# Patient Record
Sex: Male | Born: 1960 | Hispanic: No | Marital: Married
Health system: Midwestern US, Academic
[De-identification: ages and names within clinical notes are randomized; demographics above are authoritative.]

## PROBLEM LIST (undated history)

## (undated) DIAGNOSIS — E785 Hyperlipidemia, unspecified: Secondary | ICD-10-CM

## (undated) DIAGNOSIS — E1165 Type 2 diabetes mellitus with hyperglycemia: Secondary | ICD-10-CM

## (undated) HISTORY — PX: APPENDECTOMY: SHX54

## (undated) HISTORY — DX: Type 2 diabetes mellitus with hyperglycemia: E11.65

---

## 2012-10-21 ENCOUNTER — Encounter: Payer: Self-pay | Admitting: Family Medicine

## 2012-10-21 ENCOUNTER — Ambulatory Visit (INDEPENDENT_AMBULATORY_CARE_PROVIDER_SITE_OTHER): Payer: BC Managed Care – PPO | Admitting: Family Medicine

## 2012-10-21 VITALS — BP 129/77 | HR 92 | Temp 97.4°F | Ht 74.5 in | Wt 222.0 lb

## 2012-10-21 DIAGNOSIS — F17200 Nicotine dependence, unspecified, uncomplicated: Secondary | ICD-10-CM

## 2012-10-21 DIAGNOSIS — R739 Hyperglycemia, unspecified: Secondary | ICD-10-CM

## 2012-10-21 DIAGNOSIS — Z0000 Encounter for general adult medical examination without abnormal findings: Secondary | ICD-10-CM

## 2012-10-21 DIAGNOSIS — IMO0001 Reserved for inherently not codable concepts without codable children: Secondary | ICD-10-CM

## 2012-10-21 DIAGNOSIS — R7309 Other abnormal glucose: Secondary | ICD-10-CM

## 2012-10-21 DIAGNOSIS — Z720 Tobacco use: Secondary | ICD-10-CM

## 2012-10-21 MED ORDER — METFORMIN HCL 500 MG PO TABS: 500.0000 mg | ORAL_TABLET | Freq: Two times a day (BID) | ORAL | Status: DC

## 2012-10-21 NOTE — Patient Instructions (Addendum)
Smoking Cessation Quitting smoking is important to your health and has many advantages. However, it is not always easy to quit since nicotine is a very addictive drug. Often times, people try 3 times or more before being able to quit. This document explains the best ways for you to prepare to quit smoking. Quitting takes hard work and a lot of effort, but you can do it. ADVANTAGES OF QUITTING SMOKING  You will live longer, feel better, and live better.  Your body will feel the impact of quitting smoking almost immediately.  Within 20 minutes, blood pressure decreases. Your pulse returns to its normal level.  After 8 hours, carbon monoxide levels in the blood return to normal. Your oxygen level increases.  After 24 hours, the chance of having a heart attack starts to decrease. Your breath, hair, and body stop smelling like smoke.  After 48 hours, damaged nerve endings begin to recover. Your sense of taste and smell improve.  After 72 hours, the body is virtually free of nicotine. Your bronchial tubes relax and breathing becomes easier.  After 2 to 12 weeks, lungs can hold more air. Exercise becomes easier and circulation improves.  The risk of having a heart attack, stroke, cancer, or lung disease is greatly reduced.  After 1 year, the risk of coronary heart disease is cut in half.  After 5 years, the risk of stroke falls to the same as a nonsmoker.  After 10 years, the risk of lung cancer is cut in half and the risk of other cancers decreases significantly.  After 15 years, the risk of coronary heart disease drops, usually to the level of a nonsmoker.  If you are pregnant, quitting smoking will improve your chances of having a healthy baby.  The people you live with, especially any children, will be healthier.  You will have extra money to spend on things other than cigarettes. QUESTIONS TO THINK ABOUT BEFORE ATTEMPTING TO QUIT You may want to talk about your answers with your  caregiver.  Why do you want to quit?  If you tried to quit in the past, what helped and what did not?  What will be the most difficult situations for you after you quit? How will you plan to handle them?  Who can help you through the tough times? Your family? Friends? A caregiver?  What pleasures do you get from smoking? What ways can you still get pleasure if you quit? Here are some questions to ask your caregiver:  How can you help me to be successful at quitting?  What medicine do you think would be best for me and how should I take it?  What should I do if I need more help?  What is smoking withdrawal like? How can I get information on withdrawal? GET READY  Set a quit date.  Change your environment by getting rid of all cigarettes, ashtrays, matches, and lighters in your home, car, or work. Do not let people smoke in your home.  Review your past attempts to quit. Think about what worked and what did not. GET SUPPORT AND ENCOURAGEMENT You have a better chance of being successful if you have help. You can get support in many ways.  Tell your family, friends, and co-workers that you are going to quit and need their support. Ask them not to smoke around you.  Get individual, group, or telephone counseling and support. Programs are available at local hospitals and health centers. Call your local health department for   information about programs in your area.  Spiritual beliefs and practices may help some smokers quit.  Download a "quit meter" on your computer to keep track of quit statistics, such as how long you have gone without smoking, cigarettes not smoked, and money saved.  Get a self-help book about quitting smoking and staying off of tobacco. LEARN NEW SKILLS AND BEHAVIORS  Distract yourself from urges to smoke. Talk to someone, go for a walk, or occupy your time with a task.  Change your normal routine. Take a different route to work. Drink tea instead of coffee.  Eat breakfast in a different place.  Reduce your stress. Take a hot bath, exercise, or read a book.  Plan something enjoyable to do every day. Reward yourself for not smoking.  Explore interactive web-based programs that specialize in helping you quit. GET MEDICINE AND USE IT CORRECTLY Medicines can help you stop smoking and decrease the urge to smoke. Combining medicine with the above behavioral methods and support can greatly increase your chances of successfully quitting smoking.  Nicotine replacement therapy helps deliver nicotine to your body without the negative effects and risks of smoking. Nicotine replacement therapy includes nicotine gum, lozenges, inhalers, nasal sprays, and skin patches. Some may be available over-the-counter and others require a prescription.  Antidepressant medicine helps people abstain from smoking, but how this works is unknown. This medicine is available by prescription.  Nicotinic receptor partial agonist medicine simulates the effect of nicotine in your brain. This medicine is available by prescription. Ask your caregiver for advice about which medicines to use and how to use them based on your health history. Your caregiver will tell you what side effects to look out for if you choose to be on a medicine or therapy. Carefully read the information on the package. Do not use any other product containing nicotine while using a nicotine replacement product.  RELAPSE OR DIFFICULT SITUATIONS Most relapses occur within the first 3 months after quitting. Do not be discouraged if you start smoking again. Remember, most people try several times before finally quitting. You may have symptoms of withdrawal because your body is used to nicotine. You may crave cigarettes, be irritable, feel very hungry, cough often, get headaches, or have difficulty concentrating. The withdrawal symptoms are only temporary. They are strongest when you first quit, but they will go away within  10 14 days. To reduce the chances of relapse, try to:  Avoid drinking alcohol. Drinking lowers your chances of successfully quitting.  Reduce the amount of caffeine you consume. Once you quit smoking, the amount of caffeine in your body increases and can give you symptoms, such as a rapid heartbeat, sweating, and anxiety.  Avoid smokers because they can make you want to smoke.  Do not let weight gain distract you. Many smokers will gain weight when they quit, usually less than 10 pounds. Eat a healthy diet and stay active. You can always lose the weight gained after you quit.  Find ways to improve your mood other than smoking. FOR MORE INFORMATION  www.smokefree.gov  Document Released: 04/29/2001 Document Revised: 11/04/2011 Document Reviewed: 08/14/2011 [facility] Patient Information 2014 [place], [place]. Diabetes and Exercise Regular exercise is important and can help:   Control blood glucose (sugar).  Decrease blood pressure.    Control blood lipids (cholesterol, triglycerides).  Improve overall health. BENEFITS FROM EXERCISE  Improved fitness.  Improved flexibility.  Improved endurance.  Increased bone density.  Weight control.  Increased muscle strength.  Decreased body  fat.  Improvement of the body's use of insulin, a hormone.  Increased insulin sensitivity.  Reduction of insulin needs.  Reduced stress and tension.  Helps you feel better. People with diabetes who add exercise to their lifestyle gain additional benefits, including:  Weight loss.  Reduced appetite.  Improvement of the body's use of blood glucose.  Decreased risk factors for heart disease:  Lowering of cholesterol and triglycerides.  Raising the level of good cholesterol (high-density lipoproteins, HDL).  Lowering blood sugar.  Decreased blood pressure. TYPE 1 DIABETES AND EXERCISE  Exercise will usually lower your blood glucose.  If blood glucose is greater than 240 mg/dl,  check urine ketones. If ketones are present, do not exercise.  Location of the insulin injection sites may need to be adjusted with exercise. Avoid injecting insulin into areas of the body that will be exercised. For example, avoid injecting insulin into:  The arms when playing tennis.  The legs when jogging. For more information, discuss this with your caregiver.  Keep a record of:  Food intake.  Type and amount of exercise.  Expected peak times of insulin action.  Blood glucose levels. Do this before, during, and after exercise. Review your records with your caregiver. This will help you to develop guidelines for adjusting food intake and insulin amounts.  TYPE 2 DIABETES AND EXERCISE  Regular physical activity can help control blood glucose.  Exercise is important because it may:  Increase the body's sensitivity to insulin.  Improve blood glucose control.  Exercise reduces the risk of heart disease. It decreases serum cholesterol and triglycerides. It also lowers blood pressure.  Those who take insulin or oral hypoglycemic agents should watch for signs of hypoglycemia. These signs include dizziness, shaking, sweating, chills, and confusion.  Body water is lost during exercise. It must be replaced. This will help to avoid loss of body fluids (dehydration) or heat stroke. Be sure to talk to your caregiver before starting an exercise program to make sure it is safe for you. Remember, any activity is better than none.  Document Released: 07/26/2003 Document Revised: 07/28/2011 Document Reviewed: 11/09/2008  Diets for Diabetes, Food Labeling Look at food labels to help you decide how much of a product you can eat. You will want to check the amount of total carbohydrate in a serving to see how the food fits into your meal plan. In the list of ingredients, the ingredient present in the largest amount by weight must be listed first, followed by the other ingredients in descending  order. STANDARD OF IDENTITY Most products have a list of ingredients. However, foods that the Food and Drug Administration (FDA) has given a standard of identity do not need a list of ingredients. A standard of identity means that a food must contain certain ingredients if it is called a particular name. Examples are mayonnaise, peanut butter, ketchup, jelly, and cheese. LABELING TERMS There are many terms found on food labels. Some of these terms have specific definitions. Some terms are regulated by the FDA, and the FDA has clearly specified how they can be used. Others are not regulated or well-defined and can be misleading and confusing. SPECIFICALLY DEFINED TERMS Nutritive Sweetener.  A sweetener that contains calories,such as table sugar or honey. Nonnutritive Sweetener.  A sweetener with few or no calories,such as saccharin, aspartame, sucralose, and cyclamate. LABELING TERMS REGULATED BY THE FDA Free.  The product contains only a tiny or small amount of fat, cholesterol, sodium, sugar, or calories. For example,  a "fat-free" product will contain less than 0.5 g of fat per serving. Low.  A food described as "low" in fat, saturated fat, cholesterol, sodium, or calories could be eaten fairly often without exceeding dietary guidelines. For example, "low in fat" means no more than 3 g of fat per serving. Lean.  "Lean" and "extra lean" are U.S. Department of Agriculture Architect) terms for use on meat and poultry products. "Lean" means the product contains less than 10 g of fat, 4 g of saturated fat, and 95 mg of cholesterol per serving. "Lean" is not as low in fat as a product labeled "low." Extra Lean.  "Extra lean" means the product contains less than 5 g of fat, 2 g of saturated fat, and 95 mg of cholesterol per serving. While "extra lean" has less fat than "lean," it is still higher in fat than a product labeled "low." Reduced, Less, Fewer.  A diet product that contains 25% less of a  nutrient or calories than the regular version. For example, hot dogs might be labeled "25% less fat than our regular hot dogs." Light/Lite.  A diet product that contains  fewer calories or  the fat of the original. For example, "light in sodium" means a product with  the usual sodium. More.  One serving contains at least 10% more of the daily value of a vitamin, mineral, or fiber than usual. Good Source Of.  One serving contains 10% to 19% of the daily value for a particular vitamin, mineral, or fiber. Excellent Source Of.  One serving contains 20% or more of the daily value for a particular nutrient. Other terms used might be "high in" or "rich in." Enriched or Fortified.  The product contains added vitamins, minerals, or protein. Nutrition labeling must be used on enriched or fortified foods. Imitation.  The product has been altered so that it is lower in protein, vitamins, or minerals than the usual food,such as imitation peanut butter. Total Fat.  The number listed is the total of all fat found in a serving of the product. Under total fat, food labels must list saturated fat and trans fat, which are associated with raising bad cholesterol and an increased risk of heart blood vessel disease. Saturated Fat.  Mainly fats from animal-based sources. Some examples are red meat, cheese, cream, whole milk, and coconut oil. Trans Fat.  Found in some fried snack foods, packaged foods, and fried restaurant foods. It is recommended you eat as close to 0 g of trans fat as possible, since it raises bad cholesterol and lowers good cholesterol. Polyunsaturated and Monounsaturated Fats.  More healthful fats. These fats are from plant sources. Total Carbohydrate.  The number of carbohydrate grams in a serving of the product. Under total carbohydrate are listed the other carbohydrate sources, such as dietary fiber and sugars. Dietary Fiber.  A carbohydrate from plant  sources. Sugars.  Sugars listed on the label contain all naturally occurring sugars as well as added sugars. LABELING TERMS NOT REGULATED BY THE FDA Sugarless.  Table sugar (sucrose) has not been added. However, the manufacturer may use another form of sugar in place of sucrose to sweeten the product. For example, sugar alcohols are used to sweeten foods. Sugar alcohols are a form of sugar but are not table sugar. If a product contains sugar alcohols in place of sucrose, it can still be labeled "sugarless." Low Salt, Salt-Free, Unsalted, No Salt, No Salt Added, Without Added Salt.  Food that is usually processed with  salt has been made without salt. However, the food may contain sodium-containing additives, such as preservatives, leavening agents, or flavorings. Natural.  This term has no legal meaning. Organic.  Foods that are certified as organic have been inspected and approved by the USDA to ensure they are produced without pesticides, fertilizers containing synthetic ingredients, bioengineering, or ionizing radiation. Document Released: 05/08/2003 Document Revised: 07/28/2011 Document Reviewed: 11/23/2008 [facility] Patient Information 2014 [place], [place].  [employer] Patient Information 2014 [place], [place].

## 2012-10-21 NOTE — Progress Notes (Signed)
°  Subjective:    Patient ID: Randy Mcmahon, male    DOB: [birth year], 52 y.o.   MRN: [id number]  Diabetes He presents for his initial (Had elevated glucose on recent labs and hgbaic is 8.8%) diabetic visit. He has type 2 diabetes mellitus. No MedicAlert identification noted. The initial diagnosis of diabetes was made 6 months ago. His disease course has been worsening. There are no hypoglycemic associated symptoms. Associated symptoms include polyuria. Pertinent negatives for diabetes include no blurred vision, no foot paresthesias, no foot ulcerations, no polydipsia, no polyphagia and no weight loss. There are no diabetic complications. Risk factors for coronary artery disease include diabetes mellitus, family history and tobacco exposure. When asked about current treatments, none were reported. His weight is stable. He is following a high fat/cholesterol diet. When asked about meal planning, he reported none. He participates in exercise intermittently. There is no change in his home blood glucose trend. An ACE inhibitor/angiotensin II receptor blocker is not being taken. He does not see a podiatrist.Eye exam is not current.  Nicotine Dependence Presents for initial visit. Symptoms include cravings. Preferred tobacco types include cigarettes. Preferred cigarette types include filtered. Preferred strength is light. His urge triggers include company of smokers. His first smoke is in the afternoon. He smokes < 1/2 a pack of cigarettes per day. He started smoking when he was >67 years old. Past treatments include nothing. The treatment provided no relief.      Review of Systems  Constitutional: Negative.  Negative for weight loss.  HENT: Negative.   Eyes: Negative.  Negative for blurred vision.  Respiratory: Negative.   Endocrine: Positive for polyuria. Negative for polydipsia and polyphagia.       Objective:   Physical Exam  Constitutional: He is oriented to person, place, and time. He appears  well-developed and well-nourished.  HENT:  Head: Normocephalic and atraumatic.  Eyes: Pupils are equal, round, and reactive to light.  Cardiovascular: Normal rate and regular rhythm.   Pulmonary/Chest: Effort normal and breath sounds normal.  Abdominal: Soft.  Genitourinary: Rectum normal and prostate normal.  Musculoskeletal: Normal range of motion.  Neurological: He is alert and oriented to person, place, and time. No cranial nerve deficit.   Results for orders placed in visit on 10/21/12  POCT GLYCOSYLATED HEMOGLOBIN (HGB A1C)      Result Value Range   Hemoglobin A1C 8.8%    POCT UA - MICROALBUMIN      Result Value Range   Microalbumin Ur, POC neg           Assessment & Plan:  Type II or unspecified type diabetes mellitus without mention of complication, uncontrolled - Plan: metFORMIN (GLUCOPHAGE) 500 MG tablet, DISCONTINUED: metFORMIN (GLUCOPHAGE) 500 MG tablet, DISCONTINUED: metFORMIN (GLUCOPHAGE) 500 MG tablet  Elevated blood sugar - Plan: POCT glycosylated hemoglobin (Hb A1C), POCT UA - Microalbumin, metFORMIN (GLUCOPHAGE) 500 MG tablet, DISCONTINUED: metFORMIN (GLUCOPHAGE) 500 MG tablet, DISCONTINUED: metFORMIN (GLUCOPHAGE) 500 MG tablet  Health care maintenance - Plan: Ambulatory referral to Gastroenterology  Tobacco abuse Discussed quitting smoking.  Glucometer- one touch  ASA 81mg  po qd otc  Follow up in 3 months

## 2012-10-22 ENCOUNTER — Encounter: Payer: Self-pay | Admitting: Internal Medicine

## 2012-10-28 ENCOUNTER — Telehealth: Payer: Self-pay | Admitting: Family Medicine

## 2012-10-28 ENCOUNTER — Other Ambulatory Visit: Payer: Self-pay | Admitting: Nurse Practitioner

## 2012-10-28 ENCOUNTER — Other Ambulatory Visit: Payer: Self-pay | Admitting: Family Medicine

## 2012-10-28 MED ORDER — ONETOUCH ULTRA SYSTEM W/DEVICE KIT: 1.0000 | PACK | Freq: Once | Status: AC

## 2012-10-28 MED ORDER — GLUCOSE BLOOD VI STRP: ORAL_STRIP | Status: AC

## 2012-10-28 NOTE — Telephone Encounter (Signed)
Glucometer strips sent

## 2012-10-28 NOTE — Telephone Encounter (Addendum)
[facility] not get test [facility] can we write another rx for them need the name of the machine for the test strips and the directions.

## 2012-11-01 NOTE — Telephone Encounter (Signed)
Patient aware of labs has an appointment on 7/27 with the pharmacist

## 2012-11-02 NOTE — Telephone Encounter (Signed)
Strips called in on 10/28/2012

## 2012-12-13 ENCOUNTER — Ambulatory Visit: Payer: BC Managed Care – PPO

## 2012-12-17 ENCOUNTER — Ambulatory Visit (AMBULATORY_SURGERY_CENTER): Payer: BC Managed Care – PPO

## 2012-12-17 VITALS — Ht 74.0 in | Wt 214.8 lb

## 2012-12-17 DIAGNOSIS — Z1211 Encounter for screening for malignant neoplasm of colon: Secondary | ICD-10-CM

## 2012-12-17 MED ORDER — NA SULFATE-K SULFATE-MG SULF 17.5-3.13-1.6 GM/177ML PO SOLN: 1.0000 | Freq: Once | ORAL | Status: DC

## 2012-12-20 ENCOUNTER — Encounter: Payer: Self-pay | Admitting: Internal Medicine

## 2012-12-22 ENCOUNTER — Encounter: Payer: Self-pay | Admitting: *Deleted

## 2012-12-31 ENCOUNTER — Ambulatory Visit (AMBULATORY_SURGERY_CENTER): Payer: BC Managed Care – PPO | Admitting: Internal Medicine

## 2012-12-31 ENCOUNTER — Encounter: Payer: Self-pay | Admitting: Internal Medicine

## 2012-12-31 VITALS — BP 121/73 | HR 70 | Temp 97.8°F | Resp 22 | Ht 74.0 in | Wt 214.0 lb

## 2012-12-31 DIAGNOSIS — IMO0001 Reserved for inherently not codable concepts without codable children: Secondary | ICD-10-CM

## 2012-12-31 DIAGNOSIS — Z1211 Encounter for screening for malignant neoplasm of colon: Secondary | ICD-10-CM

## 2012-12-31 HISTORY — DX: Reserved for inherently not codable concepts without codable children: IMO0001

## 2012-12-31 MED ORDER — SODIUM CHLORIDE 0.9 % IV SOLN: 500.0000 mL | INTRAVENOUS | Status: DC

## 2012-12-31 NOTE — Op Note (Signed)
[facility] [address] [place], [address]   COLONOSCOPY PROCEDURE REPORT  PATIENT: Randy Mcmahon, Randy Mcmahon  MR#: [id number] BIRTHDATE: 07/12/1960 , 51  yrs. old GENDER: Male ENDOSCOPIST: Iva Boop, MD, [facility] REFERRED BY:   Nils Pyle, NP PROCEDURE DATE:  12/31/2012 PROCEDURE:   Colonoscopy, screening First Screening Colonoscopy - Avg.  risk and is 50 yrs.  old or older Yes.  Prior Negative Screening - Now for repeat screening. N/A  History of Adenoma - Now for follow-up colonoscopy & has been > or = to 3 yrs.  N/A  Polyps Removed Today? No.  Recommend repeat exam, <10 yrs? No. ASA CLASS:   Class II INDICATIONS:average risk screening. MEDICATIONS: Propofol (Diprivan) 270 mg IV, MAC sedation, administered by CRNA, and These medications were titrated to patient response per physician's verbal order  DESCRIPTION OF PROCEDURE:   After the risks benefits and alternatives of the procedure were thoroughly explained, informed consent was obtained.  A digital rectal exam revealed no abnormalities of the rectum, A digital rectal exam revealed no prostatic nodules, and A digital rectal exam revealed the prostate was not enlarged.   The LB [id number]  endoscope was introduced through the anus and advanced to the cecum, which was identified by both the appendix and ileocecal valve. No adverse events experienced.   The quality of the prep was excellent using Suprep  The instrument was then slowly withdrawn as the colon was fully examined.      COLON FINDINGS: A normal appearing cecum, ileocecal valve, and appendiceal orifice were identified.  The ascending, hepatic flexure, transverse, splenic flexure, descending, sigmoid colon and rectum appeared unremarkable.  No polyps or cancers were seen.   A right colon retroflexion was performed.  Retroflexed views revealed no abnormalities. The time to cecum=2 minutes 11 seconds. Withdrawal time=10 minutes 23 seconds.   The scope was withdrawn and the procedure completed. COMPLICATIONS: There were no complications.  ENDOSCOPIC IMPRESSION: Normal colonoscopy  RECOMMENDATIONS: Repeat colonoscopy 10 years.   eSigned:  Iva Boop, MD, [facility] 12/31/2012 12:58 PM   cc: The Patient  and Nils Pyle, FNP

## 2012-12-31 NOTE — Progress Notes (Signed)
Patient did not have preoperative order for IV antibiotic SSI prophylaxis. (G8918)  Patient did not experience any of the following events: a burn prior to discharge; a fall within the facility; wrong site/side/patient/procedure/implant event; or a hospital transfer or hospital admission upon discharge from the facility. ([id number])  

## 2012-12-31 NOTE — Progress Notes (Signed)
A/O X 3 PLEASED REPORT TO KAREN RN

## 2012-12-31 NOTE — Patient Instructions (Addendum)
The colonoscopy was normal - no polyps or cancer seen.  Next routine colonoscopy in 10 years - 2024.  I appreciate the opportunity to care for you.  Iva Boop, MD, FACG  YOU HAD AN ENDOSCOPIC PROCEDURE TODAY AT THE [facility]: Refer to the procedure report that was given to you for any specific questions about what was found during the examination.  If the procedure report does not answer your questions, please call your gastroenterologist to clarify.  If you requested that your care partner not be given the details of your procedure findings, then the procedure report has been included in a sealed envelope for you to review at your convenience later.  YOU SHOULD EXPECT: Some feelings of bloating in the abdomen. Passage of more gas than usual.  Walking can help get rid of the air that was put into your GI tract during the procedure and reduce the bloating. If you had a lower endoscopy (such as a colonoscopy or flexible sigmoidoscopy) you may notice spotting of blood in your stool or on the toilet paper. If you underwent a bowel prep for your procedure, then you may not have a normal bowel movement for a few days.  DIET: Your first meal following the procedure should be a light meal and then it is ok to progress to your normal diet.  A half-sandwich or bowl of soup is an example of a good first meal.  Heavy or fried foods are harder to digest and may make you feel nauseous or bloated.  Likewise meals heavy in dairy and vegetables can cause extra gas to form and this can also increase the bloating.  Drink plenty of fluids but you should avoid alcoholic beverages for 24 hours.  ACTIVITY: Your care partner should take you home directly after the procedure.  You should plan to take it easy, moving slowly for the rest of the day.  You can resume normal activity the day after the procedure however you should NOT DRIVE or use heavy machinery for 24 hours (because of the sedation medicines  used during the test).    SYMPTOMS TO REPORT IMMEDIATELY: A gastroenterologist can be reached at any hour.  During normal business hours, 8:30 AM to 5:00 PM Monday through Friday, call [phone].  After hours and on weekends, please call the GI answering service at [phone] who will take a message and have the physician on call contact you.   Following lower endoscopy (colonoscopy or flexible sigmoidoscopy):  Excessive amounts of blood in the stool  Significant tenderness or worsening of abdominal pains  Swelling of the abdomen that is new, acute  Fever of 100F or higher FOLLOW UP: If any biopsies were taken you will be contacted by phone or by letter within the next 1-3 weeks.  Call your gastroenterologist if you have not heard about the biopsies in 3 weeks.  Our staff will call the home number listed on your records the next business day following your procedure to check on you and address any questions or concerns that you may have at that time regarding the information given to you following your procedure. This is a courtesy call and so if there is no answer at the home number and we have not heard from you through the emergency physician on call, we will assume that you have returned to your regular daily activities without incident.  SIGNATURES/CONFIDENTIALITY: You and/or your care partner have signed paperwork which will be entered into your  electronic medical record.  These signatures attest to the fact that that the information above on your After Visit Summary has been reviewed and is understood.  Full responsibility of the confidentiality of this discharge information lies with you and/or your care-partner.

## 2013-01-03 ENCOUNTER — Telehealth: Payer: Self-pay | Admitting: *Deleted

## 2013-01-03 NOTE — Telephone Encounter (Signed)
  Follow up Call-  Call back number 12/31/2012  Post procedure Call Back phone  # [phone]  Permission to leave phone message Yes     No answer,left message.

## 2015-08-29 DIAGNOSIS — E119 Type 2 diabetes mellitus without complications: Secondary | ICD-10-CM | POA: Diagnosis not present

## 2015-09-12 ENCOUNTER — Encounter (INDEPENDENT_AMBULATORY_CARE_PROVIDER_SITE_OTHER): Payer: Self-pay

## 2015-09-12 ENCOUNTER — Ambulatory Visit (INDEPENDENT_AMBULATORY_CARE_PROVIDER_SITE_OTHER): Payer: BLUE CROSS/BLUE SHIELD | Admitting: Family Medicine

## 2015-09-12 ENCOUNTER — Encounter: Payer: Self-pay | Admitting: Family Medicine

## 2015-09-12 VITALS — BP 134/82 | HR 89 | Temp 97.6°F | Ht 74.0 in | Wt 224.4 lb

## 2015-09-12 DIAGNOSIS — E119 Type 2 diabetes mellitus without complications: Secondary | ICD-10-CM | POA: Insufficient documentation

## 2015-09-12 DIAGNOSIS — Z0000 Encounter for general adult medical examination without abnormal findings: Secondary | ICD-10-CM

## 2015-09-12 NOTE — Progress Notes (Signed)
BP 134/82 mmHg  Pulse 89  Temp(Src) 97.6 F (36.4 C) (Oral)  Ht _0  (1.88 m)  Wt 224 lb 6.4 oz (101.787 kg)  BMI 28.80 kg/m2   Subjective:    Patient ID: Randy Mcmahon, male    DOB: 1960-10-13, 55 y.o.   MRN: [id number]  HPI: Randy Mcmahon is a 55 y.o. male presenting on 09/12/2015 for Annual Exam   HPI Adult well exam Patient is coming in today for his adult well exam annually through his work. He denies any major issues. He does admit that he has diabetes but he is working through his [employer] to manage that. He sees her every 3 months and had an ophthalmology visit about a year ago and also had a colonoscopy recently. We will have him request all of these records so that we can get copies of them. He says he is due to the labs again next week and will send [place] copies of those. He denies any chest pain, shortness of breath, headaches or vision issues, abdominal complaints, diarrhea, nausea, vomiting, or joint issues.   Relevant past medical, surgical, family and social history reviewed and updated as indicated. Interim medical history since our last visit reviewed. Allergies and medications reviewed and updated.  Review of Systems  Constitutional: Negative for fever.  HENT: Negative for ear discharge and ear pain.   Eyes: Negative for discharge and visual disturbance.  Respiratory: Negative for shortness of breath and wheezing.   Cardiovascular: Negative for chest pain and leg swelling.  Gastrointestinal: Negative for abdominal pain, diarrhea and constipation.  Genitourinary: Negative for difficulty urinating.  Musculoskeletal: Negative for back pain and gait problem.  Skin: Negative for rash.  Neurological: Negative for syncope, light-headedness and headaches.  All other systems reviewed and are negative.   Per HPI unless specifically indicated above     Medication List       This list is accurate as of: 09/12/15  3:25 PM.  Always use your most  recent med list.               glucose blood test strip  Use as instructed     HM VITAMIN D3 4000 units Caps  Generic drug:  Cholecalciferol  Take by mouth.     metFORMIN 500 MG tablet  Commonly known as:  GLUCOPHAGE  Take 1 tablet (500 mg total) by mouth 2 (two) times daily with a meal.     ONE TOUCH ULTRA SYSTEM KIT w/Device Kit  1 kit by Does not apply route once.     ONETOUCH DELICA LANCETS 65Y Misc           Objective:    BP 134/82 mmHg  Pulse 89  Temp(Src) 97.6 F (36.4 C) (Oral)  Ht _1  (1.88 m)  Wt 224 lb 6.4 oz (101.787 kg)  BMI 28.80 kg/m2  Wt Readings from Last 3 Encounters:  09/12/15 224 lb 6.4 oz (101.787 kg)  12/31/12 214 lb (97.07 kg)  12/17/12 214 lb 12.8 oz (97.433 kg)    Physical Exam  Constitutional: He is oriented to person, place, and time. He appears well-developed and well-nourished. No distress.  HENT:  Right Ear: External ear normal.  Left Ear: External ear normal.  Nose: Nose normal.  Mouth/Throat: Oropharynx is clear and moist.  Eyes: Conjunctivae and EOM are normal. Pupils are equal, round, and reactive to light. Right eye exhibits no discharge. No scleral icterus.  Neck: Neck supple. No thyromegaly present.  Cardiovascular: Normal rate, regular rhythm, normal heart sounds and intact distal pulses.   No murmur heard. Pulmonary/Chest: Effort normal and breath sounds normal. No respiratory distress. He has no wheezes.  Abdominal: He exhibits no distension and no mass. There is no tenderness. There is no rebound and no guarding.  Genitourinary:  Patient declined  Musculoskeletal: Normal range of motion. He exhibits no edema.  Lymphadenopathy:    He has no cervical adenopathy.  Neurological: He is alert and oriented to person, place, and time. He displays normal reflexes. No cranial nerve deficit. He exhibits normal muscle tone. Coordination normal.  Skin: Skin is warm and dry. No rash noted. He is not diaphoretic.  Psychiatric: He  has a normal mood and affect. His behavior is normal.  Nursing note and vitals reviewed.   Results for orders placed or performed in visit on 10/21/12  POCT glycosylated hemoglobin (Hb A1C)  Result Value Ref Range   Hemoglobin A1C 8.8%   POCT UA - Microalbumin  Result Value Ref Range   Microalbumin Ur, POC neg mg/L      Assessment & Plan:   Problem List Items Addressed This Visit      Endocrine   Type 2 diabetes mellitus not at goal [facility]) - Primary    Patient gets all labs and three-month checks through his work at [employer]. He also gets his foot exams and has seen an ophthalmologist through them as well. He will have the records sent to [place].       Other Visit Diagnoses    Well adult exam            Follow up plan: Return in about 1 year (around 09/11/2016), or if symptoms worsen or fail to improve.  Counseling provided for all of the vaccine components No orders of the defined types were placed in this encounter.    Caryl Pina, MD [facility] 09/12/2015, 3:25 PM

## 2015-09-12 NOTE — Assessment & Plan Note (Addendum)
Patient gets all labs and three-month checks through his work at [employer]. He also gets his foot exams and has seen an ophthalmologist through them as well. He will have the records sent to us.

## 2015-09-19 DIAGNOSIS — E119 Type 2 diabetes mellitus without complications: Secondary | ICD-10-CM | POA: Diagnosis not present

## 2015-09-19 DIAGNOSIS — E782 Mixed hyperlipidemia: Secondary | ICD-10-CM | POA: Diagnosis not present

## 2015-09-19 DIAGNOSIS — F17210 Nicotine dependence, cigarettes, uncomplicated: Secondary | ICD-10-CM | POA: Diagnosis not present

## 2015-09-19 DIAGNOSIS — Z79899 Other long term (current) drug therapy: Secondary | ICD-10-CM | POA: Diagnosis not present

## 2015-09-19 DIAGNOSIS — E559 Vitamin D deficiency, unspecified: Secondary | ICD-10-CM | POA: Diagnosis not present

## 2015-09-26 DIAGNOSIS — E119 Type 2 diabetes mellitus without complications: Secondary | ICD-10-CM | POA: Diagnosis not present

## 2015-09-26 DIAGNOSIS — E782 Mixed hyperlipidemia: Secondary | ICD-10-CM | POA: Diagnosis not present

## 2015-09-26 DIAGNOSIS — E559 Vitamin D deficiency, unspecified: Secondary | ICD-10-CM | POA: Diagnosis not present

## 2015-12-07 ENCOUNTER — Encounter: Payer: Self-pay | Admitting: Family Medicine

## 2015-12-26 DIAGNOSIS — Z008 Encounter for other general examination: Secondary | ICD-10-CM | POA: Diagnosis not present

## 2015-12-26 DIAGNOSIS — E559 Vitamin D deficiency, unspecified: Secondary | ICD-10-CM | POA: Diagnosis not present

## 2015-12-26 DIAGNOSIS — E782 Mixed hyperlipidemia: Secondary | ICD-10-CM | POA: Diagnosis not present

## 2015-12-26 DIAGNOSIS — Z79899 Other long term (current) drug therapy: Secondary | ICD-10-CM | POA: Diagnosis not present

## 2015-12-26 DIAGNOSIS — E119 Type 2 diabetes mellitus without complications: Secondary | ICD-10-CM | POA: Diagnosis not present

## 2016-01-16 DIAGNOSIS — E119 Type 2 diabetes mellitus without complications: Secondary | ICD-10-CM | POA: Diagnosis not present

## 2016-01-16 DIAGNOSIS — E782 Mixed hyperlipidemia: Secondary | ICD-10-CM | POA: Diagnosis not present

## 2016-01-16 DIAGNOSIS — E559 Vitamin D deficiency, unspecified: Secondary | ICD-10-CM | POA: Diagnosis not present

## 2016-03-12 DIAGNOSIS — E119 Type 2 diabetes mellitus without complications: Secondary | ICD-10-CM | POA: Diagnosis not present

## 2016-04-07 DIAGNOSIS — E559 Vitamin D deficiency, unspecified: Secondary | ICD-10-CM | POA: Diagnosis not present

## 2016-04-07 DIAGNOSIS — Z008 Encounter for other general examination: Secondary | ICD-10-CM | POA: Diagnosis not present

## 2016-04-07 DIAGNOSIS — E119 Type 2 diabetes mellitus without complications: Secondary | ICD-10-CM | POA: Diagnosis not present

## 2016-04-07 DIAGNOSIS — E782 Mixed hyperlipidemia: Secondary | ICD-10-CM | POA: Diagnosis not present

## 2016-04-28 DIAGNOSIS — F17210 Nicotine dependence, cigarettes, uncomplicated: Secondary | ICD-10-CM | POA: Diagnosis not present

## 2016-04-28 DIAGNOSIS — Z716 Tobacco abuse counseling: Secondary | ICD-10-CM | POA: Diagnosis not present

## 2016-04-28 DIAGNOSIS — R030 Elevated blood-pressure reading, without diagnosis of hypertension: Secondary | ICD-10-CM | POA: Diagnosis not present

## 2016-05-21 DIAGNOSIS — E119 Type 2 diabetes mellitus without complications: Secondary | ICD-10-CM | POA: Diagnosis not present

## 2016-05-21 DIAGNOSIS — Z79899 Other long term (current) drug therapy: Secondary | ICD-10-CM | POA: Diagnosis not present

## 2016-05-21 DIAGNOSIS — E782 Mixed hyperlipidemia: Secondary | ICD-10-CM | POA: Diagnosis not present

## 2016-05-21 DIAGNOSIS — Z008 Encounter for other general examination: Secondary | ICD-10-CM | POA: Diagnosis not present

## 2016-05-21 DIAGNOSIS — E559 Vitamin D deficiency, unspecified: Secondary | ICD-10-CM | POA: Diagnosis not present

## 2016-05-27 LAB — HEMOGLOBIN A1C: HEMOGLOBIN A1C: 7.8

## 2016-06-04 DIAGNOSIS — E119 Type 2 diabetes mellitus without complications: Secondary | ICD-10-CM | POA: Diagnosis not present

## 2016-06-04 DIAGNOSIS — E559 Vitamin D deficiency, unspecified: Secondary | ICD-10-CM | POA: Diagnosis not present

## 2016-06-04 DIAGNOSIS — E782 Mixed hyperlipidemia: Secondary | ICD-10-CM | POA: Diagnosis not present

## 2016-06-04 DIAGNOSIS — F17210 Nicotine dependence, cigarettes, uncomplicated: Secondary | ICD-10-CM | POA: Diagnosis not present

## 2016-06-16 DIAGNOSIS — Z716 Tobacco abuse counseling: Secondary | ICD-10-CM | POA: Diagnosis not present

## 2016-06-16 DIAGNOSIS — F17210 Nicotine dependence, cigarettes, uncomplicated: Secondary | ICD-10-CM | POA: Diagnosis not present

## 2016-08-25 DIAGNOSIS — Z008 Encounter for other general examination: Secondary | ICD-10-CM | POA: Diagnosis not present

## 2016-08-25 DIAGNOSIS — Z719 Counseling, unspecified: Secondary | ICD-10-CM | POA: Diagnosis not present

## 2016-08-25 DIAGNOSIS — E559 Vitamin D deficiency, unspecified: Secondary | ICD-10-CM | POA: Diagnosis not present

## 2016-08-25 DIAGNOSIS — E782 Mixed hyperlipidemia: Secondary | ICD-10-CM | POA: Diagnosis not present

## 2016-08-25 DIAGNOSIS — E119 Type 2 diabetes mellitus without complications: Secondary | ICD-10-CM | POA: Diagnosis not present

## 2016-08-25 DIAGNOSIS — Z716 Tobacco abuse counseling: Secondary | ICD-10-CM | POA: Diagnosis not present

## 2016-08-25 DIAGNOSIS — E669 Obesity, unspecified: Secondary | ICD-10-CM | POA: Diagnosis not present

## 2016-08-25 DIAGNOSIS — F17210 Nicotine dependence, cigarettes, uncomplicated: Secondary | ICD-10-CM | POA: Diagnosis not present

## 2016-09-08 DIAGNOSIS — Z719 Counseling, unspecified: Secondary | ICD-10-CM | POA: Diagnosis not present

## 2016-09-08 DIAGNOSIS — E119 Type 2 diabetes mellitus without complications: Secondary | ICD-10-CM | POA: Diagnosis not present

## 2016-09-08 DIAGNOSIS — E559 Vitamin D deficiency, unspecified: Secondary | ICD-10-CM | POA: Diagnosis not present

## 2016-09-08 DIAGNOSIS — E782 Mixed hyperlipidemia: Secondary | ICD-10-CM | POA: Diagnosis not present

## 2017-01-12 DIAGNOSIS — Z716 Tobacco abuse counseling: Secondary | ICD-10-CM | POA: Diagnosis not present

## 2017-01-12 DIAGNOSIS — E669 Obesity, unspecified: Secondary | ICD-10-CM | POA: Diagnosis not present

## 2017-01-12 DIAGNOSIS — Z6830 Body mass index (BMI) 30.0-30.9, adult: Secondary | ICD-10-CM | POA: Diagnosis not present

## 2017-01-12 DIAGNOSIS — E559 Vitamin D deficiency, unspecified: Secondary | ICD-10-CM | POA: Diagnosis not present

## 2017-01-12 DIAGNOSIS — F17210 Nicotine dependence, cigarettes, uncomplicated: Secondary | ICD-10-CM | POA: Diagnosis not present

## 2017-01-12 DIAGNOSIS — E782 Mixed hyperlipidemia: Secondary | ICD-10-CM | POA: Diagnosis not present

## 2017-01-12 DIAGNOSIS — Z719 Counseling, unspecified: Secondary | ICD-10-CM | POA: Diagnosis not present

## 2017-01-12 DIAGNOSIS — E119 Type 2 diabetes mellitus without complications: Secondary | ICD-10-CM | POA: Diagnosis not present

## 2017-01-12 DIAGNOSIS — Z008 Encounter for other general examination: Secondary | ICD-10-CM | POA: Diagnosis not present

## 2017-02-09 DIAGNOSIS — Z139 Encounter for screening, unspecified: Secondary | ICD-10-CM | POA: Diagnosis not present

## 2017-02-09 DIAGNOSIS — E559 Vitamin D deficiency, unspecified: Secondary | ICD-10-CM | POA: Diagnosis not present

## 2017-02-09 DIAGNOSIS — E782 Mixed hyperlipidemia: Secondary | ICD-10-CM | POA: Diagnosis not present

## 2017-02-09 DIAGNOSIS — E119 Type 2 diabetes mellitus without complications: Secondary | ICD-10-CM | POA: Diagnosis not present

## 2017-02-09 DIAGNOSIS — Z79899 Other long term (current) drug therapy: Secondary | ICD-10-CM | POA: Diagnosis not present

## 2017-02-23 DIAGNOSIS — Z719 Counseling, unspecified: Secondary | ICD-10-CM | POA: Diagnosis not present

## 2017-02-23 DIAGNOSIS — E782 Mixed hyperlipidemia: Secondary | ICD-10-CM | POA: Diagnosis not present

## 2017-02-23 DIAGNOSIS — E119 Type 2 diabetes mellitus without complications: Secondary | ICD-10-CM | POA: Diagnosis not present

## 2017-02-23 DIAGNOSIS — Z008 Encounter for other general examination: Secondary | ICD-10-CM | POA: Diagnosis not present

## 2017-02-23 DIAGNOSIS — Z716 Tobacco abuse counseling: Secondary | ICD-10-CM | POA: Diagnosis not present

## 2017-02-23 DIAGNOSIS — Z6830 Body mass index (BMI) 30.0-30.9, adult: Secondary | ICD-10-CM | POA: Diagnosis not present

## 2017-02-23 DIAGNOSIS — E559 Vitamin D deficiency, unspecified: Secondary | ICD-10-CM | POA: Diagnosis not present

## 2017-02-23 DIAGNOSIS — F17210 Nicotine dependence, cigarettes, uncomplicated: Secondary | ICD-10-CM | POA: Diagnosis not present

## 2017-02-23 DIAGNOSIS — E669 Obesity, unspecified: Secondary | ICD-10-CM | POA: Diagnosis not present

## 2017-04-01 ENCOUNTER — Ambulatory Visit (INDEPENDENT_AMBULATORY_CARE_PROVIDER_SITE_OTHER): Payer: BLUE CROSS/BLUE SHIELD | Admitting: Family Medicine

## 2017-04-01 ENCOUNTER — Encounter: Payer: Self-pay | Admitting: Family Medicine

## 2017-04-01 VITALS — BP 138/77 | HR 80 | Temp 97.1°F | Ht 74.0 in | Wt 219.0 lb

## 2017-04-01 DIAGNOSIS — E119 Type 2 diabetes mellitus without complications: Secondary | ICD-10-CM

## 2017-04-01 DIAGNOSIS — Z0000 Encounter for general adult medical examination without abnormal findings: Secondary | ICD-10-CM | POA: Diagnosis not present

## 2017-04-03 NOTE — Progress Notes (Signed)
BP 138/77   Pulse 80   Temp (!) 97.1 F (36.2 C) (Oral)   Ht 6\' 2"  (1.88 m)   Wt 219 lb (99.3 kg)   BMI 28.12 kg/m    Subjective:    Patient ID: Randy Mcmahon, male    DOB: 03/26/1961, 56 y.o.   MRN: [id number]  HPI: Randy Mcmahon is a 56 y.o. male presenting on 04/01/2017 for Annual Exam   HPI Adult well exam and physical Patient is coming in today for an adult well exam and physical.  He gets his labs done through his workplace and his diabetes is managed by a nurse practitioner at his workplace as well.  His cholesterol is normal his glucose was 219 and she had increased his medications.  His kidney function and electrolytes were all normal.  His liver function and protein levels were all normal.  His CBC was all normal.  His A1c was 7.5 and the nurse practitioner added Jardiance and increased his metformin to hopefully improve this.  She also talked to him extensively about diet and exercise.  His vitamin B12 was mildly low at 299 and he is taking a daily over-the-counter supplement.  His PSA was 0.5 his vitamin D was 34.  He denies any major health issues and is been overall feeling well. Patient denies any chest pain, shortness of breath, headaches or vision issues, abdominal complaints, diarrhea, nausea, vomiting, or joint issues.   Relevant past medical, surgical, family and social history reviewed and updated as indicated. Interim medical history since our last visit reviewed. Allergies and medications reviewed and updated.  Review of Systems  Constitutional: Negative for chills and fever.  HENT: Negative for ear pain and tinnitus.   Eyes: Negative for pain.  Respiratory: Negative for cough, shortness of breath and wheezing.   Cardiovascular: Negative for chest pain, palpitations and leg swelling.  Gastrointestinal: Negative for abdominal pain, blood in stool, constipation and diarrhea.  Genitourinary: Negative for dysuria and hematuria.  Musculoskeletal: Negative for  back pain and myalgias.  Skin: Negative for rash.  Neurological: Negative for dizziness, weakness and headaches.  Psychiatric/Behavioral: Negative for suicidal ideas.    Per HPI unless specifically indicated above        Objective:    BP 138/77   Pulse 80   Temp (!) 97.1 F (36.2 C) (Oral)   Ht 6\' 2"  (1.88 m)   Wt 219 lb (99.3 kg)   BMI 28.12 kg/m   Wt Readings from Last 3 Encounters:  04/01/17 219 lb (99.3 kg)  09/12/15 224 lb 6.4 oz (101.8 kg)  12/31/12 214 lb (97.1 kg)    Physical Exam  Constitutional: He is oriented to person, place, and time. He appears well-developed and well-nourished. No distress.  HENT:  Right Ear: External ear normal.  Left Ear: External ear normal.  Nose: Nose normal.  Mouth/Throat: Oropharynx is clear and moist. No oropharyngeal exudate.  Eyes: Conjunctivae and EOM are normal. Pupils are equal, round, and reactive to light. No scleral icterus.  Neck: Neck supple. No thyromegaly present.  Cardiovascular: Normal rate, regular rhythm, normal heart sounds and intact distal pulses.  No murmur heard. Pulmonary/Chest: Effort normal and breath sounds normal. No respiratory distress. He has no wheezes.  Abdominal: Soft. Bowel sounds are normal. He exhibits no distension. There is no tenderness. There is no rebound and no guarding.  Musculoskeletal: Normal range of motion. He exhibits no edema.  Lymphadenopathy:    He has no cervical adenopathy.  Neurological:  He is alert and oriented to person, place, and time. Coordination normal.  Skin: Skin is warm and dry. No rash noted. He is not diaphoretic.  Psychiatric: He has a normal mood and affect. His behavior is normal.  Nursing note and vitals reviewed.   Results for orders placed or performed in visit on 05/21/16  Hemoglobin A1c  Result Value Ref Range   Hemoglobin A1C 7.8       Assessment & Plan:   Problem List Items Addressed This Visit      Endocrine   Type 2 diabetes mellitus not at  goal [facility](HCC)   Relevant Medications   JARDIANCE 10 MG TABS tablet    Other Visit Diagnoses    Well adult exam    -  Primary       Follow up plan: Return in about 1 year (around 04/01/2018), or if symptoms worsen or fail to improve.  Counseling provided for all of the vaccine components No orders of the defined types were placed in this encounter.   Arville CareJoshua Dettinger, MD [facility] 04/01/2017, 4:14 PM

## 2017-04-22 DIAGNOSIS — Z716 Tobacco abuse counseling: Secondary | ICD-10-CM | POA: Diagnosis not present

## 2017-04-22 DIAGNOSIS — F17210 Nicotine dependence, cigarettes, uncomplicated: Secondary | ICD-10-CM | POA: Diagnosis not present

## 2017-05-06 DIAGNOSIS — Z716 Tobacco abuse counseling: Secondary | ICD-10-CM | POA: Diagnosis not present

## 2017-05-06 DIAGNOSIS — F17210 Nicotine dependence, cigarettes, uncomplicated: Secondary | ICD-10-CM | POA: Diagnosis not present

## 2017-05-27 DIAGNOSIS — E119 Type 2 diabetes mellitus without complications: Secondary | ICD-10-CM | POA: Diagnosis not present

## 2017-05-27 DIAGNOSIS — F17210 Nicotine dependence, cigarettes, uncomplicated: Secondary | ICD-10-CM | POA: Diagnosis not present

## 2017-05-27 DIAGNOSIS — Z716 Tobacco abuse counseling: Secondary | ICD-10-CM | POA: Diagnosis not present

## 2017-05-27 DIAGNOSIS — E782 Mixed hyperlipidemia: Secondary | ICD-10-CM | POA: Diagnosis not present

## 2017-05-27 DIAGNOSIS — Z79899 Other long term (current) drug therapy: Secondary | ICD-10-CM | POA: Diagnosis not present

## 2017-06-08 DIAGNOSIS — E782 Mixed hyperlipidemia: Secondary | ICD-10-CM | POA: Diagnosis not present

## 2017-06-08 DIAGNOSIS — F17210 Nicotine dependence, cigarettes, uncomplicated: Secondary | ICD-10-CM | POA: Diagnosis not present

## 2017-06-08 DIAGNOSIS — Z716 Tobacco abuse counseling: Secondary | ICD-10-CM | POA: Diagnosis not present

## 2017-06-08 DIAGNOSIS — E119 Type 2 diabetes mellitus without complications: Secondary | ICD-10-CM | POA: Diagnosis not present

## 2017-07-23 ENCOUNTER — Ambulatory Visit: Admit: 2017-07-23 | Discharge: 2017-07-23 | Payer: PRIVATE HEALTH INSURANCE

## 2017-07-23 DIAGNOSIS — G5601 Carpal tunnel syndrome, right upper limb: Secondary | ICD-10-CM

## 2017-07-23 NOTE — Unmapped (Signed)
[facility]  Department of Neurology and Rehabilitation  [address]  [place], [place] [address]  [phone]   Fax [phone]  Diplomate, American Board of Neuromuscular and Electrodiagnostic Medicine  Diplomate, American Board of Physical Medicine and Rehabilitation    Test Date:  07/23/2017    Patient: Francisco Osborne DOB: November 12, 1960 Physician: Recardo Evangelist. Grae Cannata, MD   Sex: Male Height: '  Ref Phys: Sharman Crate, DC   ID#: [id number] Weight:  lbs. Technician: Recardo Evangelist. Sicily Zaragoza, MD     Patient Complaints:  57 year old right hand dominant male-- retired. 6 weeks of numbness and tingling.  He denies any traumas, neck pain, or shoulder pain. Pain and numbness from thumb and lateral 4 digits, radiates to forearm.  Worse with sleep; awakens after 2 hours.    Patient History / Exam:  Normal UE muscle bulk, tone, and strength. Normal sensation in forearm and hand.  Negative Tinel's over wrist.    Findings:   Monopolar needle EMG was performed in selected right upper extremity muscles innervated by C5-T1 nerve roots inclusive.  No spontaneous activity was seen in any muscles tested in the form of fibrillations, positive sharp waves, or fasciculations.  Voluntary motor unit morphologies are otherwise normal.    The right median sensory nerve revealed prolonged distal latency and decreased conduction velocity.  The right median motor nerve revealed prolonged distal latency and normal amplitude.  All other nerves tested were within normal limits.      IMPRESSIONS:   1. 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  A. No findings of severe axonopathy; no signs of cervical radiculopathy, or brachial plexopathy.       ___________________________  Recardo Evangelist. Dmarcus Decicco, MD        Nerve Conduction Studies  Anti Sensory Summary Table     Site NR Peak (ms) Norm Peak (ms) P-T Amp (??V) Norm  P-T Amp Site1 Site2 Delta-P (ms) Dist (cm) Vel (m/s) Norm Vel (m/s)   Right Radial Anti Sensory (Base 1st Digit)  28.9??C   Wrist    2.4 <3.1 22.9  Wrist Base 1st Digit 2.4 0.0       Ortho Sensory Summary Table     Site NR Peak (ms) Norm Peak (ms) P-T Amp (??V) Norm P-T Amp Site1 Site2 Delta-P (ms) Dist (cm) Vel (m/s) Norm Vel (m/s)   Left Palmar Ortho Sensory (Wrist)  29.1??C   Median Palm    3.1 2.4 42.7  Median Palm Wrist 3.1 8.0 26    Ulnar Palm    1.8 2.4 32.6  Ulnar Palm Wrist 1.8 8.0 44          Median Palm Ulnar Palm 1.3 0.0       Motor Summary Table     Site NR Onset (ms) Norm Onset (ms) O-P Amp (mV) Norm O-P Amp Site1 Site2 Delta-0 (ms) Dist (cm) Vel (m/s) Norm Vel (m/s)   Right Median Motor (Abd Poll Brev)  28.2??C   Wrist    5.3 <4.2 10.9 >5 Elbow Wrist 4.5 25.0 56 >50   Elbow    9.8  9.7          Right Ulnar Motor (Abd Dig Minimi)  29.7??C   Wrist    3.8 <4.2 6.5 >3 B Elbow Wrist 4.2 27.0 64 >49   B Elbow    8.0  7.6  A Elbow  B Elbow 2.0 10.0 50 >49   A Elbow    10.0  6.7            EMG     Side Muscle Nerve Root Ins Act Fibs Psw Amp Dur Poly Recrt Int Dennie Bible Comment   Right Deltoid Axillary C5-6 Nml Nml Nml Nml Nml 0 Nml Nml    Right Biceps Musculocut C5-6 Nml Nml Nml Nml Nml 0 Nml Nml    Right Triceps Radial C6-7-8 Nml Nml Nml Nml Nml 0 Nml Nml    Right PronatorTeres Median C6-7 Nml Nml Nml Nml Nml 0 Nml Nml    Right Abd Poll Brev Median C8-T1 Nml Nml Nml Nml Nml 0 Nml Nml    Right Infraspinatus SupraScap C5-6 Nml Nml Nml Nml Nml 0 Nml Nml        Nerve Conduction Studies  Anti Sensory Left/Right Comparison     Site L Lat (ms) R Lat (ms) L-R Lat (ms) L Amp (??V) R Amp (??V) L-R Amp (%) Site1 Site2 L Vel (m/s) R Vel (m/s) L-R Vel (m/s)   Radial Anti Sensory (Base 1st Digit)  28.9??C   Wrist  2.4   22.9  Wrist Base 1st Digit        Ortho Sensory Left/Right Comparison     Site L Lat (ms) R Lat (ms) L-R Lat (ms) L Amp (??V) R Amp (??V) L-R Amp (%) Site1 Site2 L Vel (m/s) R Vel (m/s) L-R Vel (m/s)   Palmar Ortho Sensory  (Wrist)  29.1??C   Median Palm 3.1   42.7   Median Palm Wrist 26     Ulnar Palm 1.8   32.6   Ulnar Palm Wrist 44            Median Palm Ulnar Palm        Motor Left/Right Comparison     Site L Lat (ms) R Lat (ms) L-R Lat (ms) L Amp (mV) R Amp (mV) L-R Amp (%) Site1 Site2 L Vel (m/s) R Vel (m/s) L-R Vel (m/s)   Median Motor (Abd Poll Brev)  28.2??C   Wrist  5.3   10.9  Elbow Wrist  56    Elbow  9.8   9.7         Ulnar Motor (Abd Dig Minimi)  29.7??C   Wrist  3.8   6.5  B Elbow Wrist  64    B Elbow  8.0   7.6  A Elbow B Elbow  50    A Elbow  10.0   6.7               Waveforms:

## 2017-08-03 DIAGNOSIS — Z716 Tobacco abuse counseling: Secondary | ICD-10-CM | POA: Diagnosis not present

## 2017-08-03 DIAGNOSIS — F17210 Nicotine dependence, cigarettes, uncomplicated: Secondary | ICD-10-CM | POA: Diagnosis not present

## 2017-08-31 DIAGNOSIS — E559 Vitamin D deficiency, unspecified: Secondary | ICD-10-CM | POA: Diagnosis not present

## 2017-08-31 DIAGNOSIS — Z719 Counseling, unspecified: Secondary | ICD-10-CM | POA: Diagnosis not present

## 2017-08-31 DIAGNOSIS — Z79899 Other long term (current) drug therapy: Secondary | ICD-10-CM | POA: Diagnosis not present

## 2017-08-31 DIAGNOSIS — Z008 Encounter for other general examination: Secondary | ICD-10-CM | POA: Diagnosis not present

## 2017-08-31 DIAGNOSIS — E782 Mixed hyperlipidemia: Secondary | ICD-10-CM | POA: Diagnosis not present

## 2017-08-31 DIAGNOSIS — Z716 Tobacco abuse counseling: Secondary | ICD-10-CM | POA: Diagnosis not present

## 2017-08-31 DIAGNOSIS — F17210 Nicotine dependence, cigarettes, uncomplicated: Secondary | ICD-10-CM | POA: Diagnosis not present

## 2017-08-31 DIAGNOSIS — E119 Type 2 diabetes mellitus without complications: Secondary | ICD-10-CM | POA: Diagnosis not present

## 2017-09-23 DIAGNOSIS — E782 Mixed hyperlipidemia: Secondary | ICD-10-CM | POA: Diagnosis not present

## 2017-09-23 DIAGNOSIS — R030 Elevated blood-pressure reading, without diagnosis of hypertension: Secondary | ICD-10-CM | POA: Diagnosis not present

## 2017-09-23 DIAGNOSIS — E119 Type 2 diabetes mellitus without complications: Secondary | ICD-10-CM | POA: Diagnosis not present

## 2017-09-23 DIAGNOSIS — E559 Vitamin D deficiency, unspecified: Secondary | ICD-10-CM | POA: Diagnosis not present

## 2017-12-28 DIAGNOSIS — Z719 Counseling, unspecified: Secondary | ICD-10-CM | POA: Diagnosis not present

## 2017-12-28 DIAGNOSIS — Z716 Tobacco abuse counseling: Secondary | ICD-10-CM | POA: Diagnosis not present

## 2017-12-28 DIAGNOSIS — E119 Type 2 diabetes mellitus without complications: Secondary | ICD-10-CM | POA: Diagnosis not present

## 2017-12-28 DIAGNOSIS — E559 Vitamin D deficiency, unspecified: Secondary | ICD-10-CM | POA: Diagnosis not present

## 2017-12-28 DIAGNOSIS — Z79899 Other long term (current) drug therapy: Secondary | ICD-10-CM | POA: Diagnosis not present

## 2017-12-28 DIAGNOSIS — F17210 Nicotine dependence, cigarettes, uncomplicated: Secondary | ICD-10-CM | POA: Diagnosis not present

## 2017-12-28 DIAGNOSIS — E782 Mixed hyperlipidemia: Secondary | ICD-10-CM | POA: Diagnosis not present

## 2017-12-28 DIAGNOSIS — Z139 Encounter for screening, unspecified: Secondary | ICD-10-CM | POA: Diagnosis not present

## 2018-01-20 DIAGNOSIS — E559 Vitamin D deficiency, unspecified: Secondary | ICD-10-CM | POA: Diagnosis not present

## 2018-01-20 DIAGNOSIS — E782 Mixed hyperlipidemia: Secondary | ICD-10-CM | POA: Diagnosis not present

## 2018-01-20 DIAGNOSIS — E119 Type 2 diabetes mellitus without complications: Secondary | ICD-10-CM | POA: Diagnosis not present

## 2018-03-03 ENCOUNTER — Encounter: Payer: Self-pay | Admitting: Family Medicine

## 2018-03-03 ENCOUNTER — Ambulatory Visit: Payer: BLUE CROSS/BLUE SHIELD | Admitting: Family Medicine

## 2018-03-03 VITALS — BP 128/71 | HR 82 | Temp 96.9°F | Ht 74.0 in | Wt 223.0 lb

## 2018-03-03 DIAGNOSIS — Z13220 Encounter for screening for lipoid disorders: Secondary | ICD-10-CM

## 2018-03-03 DIAGNOSIS — E119 Type 2 diabetes mellitus without complications: Secondary | ICD-10-CM

## 2018-03-03 MED ORDER — SITAGLIPTIN PHOSPHATE 100 MG PO TABS: 100.0000 mg | ORAL_TABLET | Freq: Every day | ORAL | 3 refills | Status: DC

## 2018-03-03 NOTE — Progress Notes (Signed)
BP 128/71   Pulse 82   Temp (!) 96.9 F (36.1 C) (Oral)   Ht _0  (1.88 m)   Wt 223 lb (101.2 kg)   BMI 28.63 kg/m    Subjective:    Patient ID: Randy Mcmahon, male    DOB: 23-Feb-1961, 57 y.o.   MRN: [id number]  HPI: Randy Mcmahon is a 57 y.o. male presenting on 03/03/2018 for Diabetes (3 month follow up)   HPI Type 2 diabetes mellitus Patient comes in today for recheck of his diabetes. Patient has been currently taking metformin and Jardiance. Patient is not currently on an ACE inhibitor/ARB. Patient has not seen an ophthalmologist this year. Patient denies any issues with their feet.   Relevant past medical, surgical, family and social history reviewed and updated as indicated. Interim medical history since our last visit reviewed. Allergies and medications reviewed and updated.  Review of Systems  Constitutional: Negative for chills and fever.  Eyes: Negative for visual disturbance.  Respiratory: Negative for shortness of breath and wheezing.   Cardiovascular: Negative for chest pain and leg swelling.  Musculoskeletal: Negative for back pain and gait problem.  Skin: Negative for rash.  Neurological: Negative for dizziness, weakness and numbness.  All other systems reviewed and are negative.   Per HPI unless specifically indicated above   Allergies as of 03/03/2018   No Known Allergies     Medication List        Accurate as of 03/03/18  1:46 PM. Always use your most recent med list.          cholecalciferol 1000 units tablet Commonly known as:  VITAMIN D Take 1,000 Units daily by mouth.   glucose blood test strip Use as instructed   JARDIANCE 25 MG Tabs tablet Generic drug:  empagliflozin Take 1 tablet by mouth daily at 2 PM.   metFORMIN 500 MG tablet Commonly known as:  GLUCOPHAGE Take 1 tablet (500 mg total) by mouth 2 (two) times daily with a meal.   ONE TOUCH ULTRA SYSTEM KIT w/Device Kit 1 kit by Does not apply route once.   ONETOUCH  DELICA LANCETS 91M Misc   rosuvastatin 10 MG tablet Commonly known as:  CRESTOR Take 10 mg by mouth daily.   sitaGLIPtin 100 MG tablet Commonly known as:  JANUVIA Take 1 tablet (100 mg total) by mouth daily.   VITAMIN B 12 PO Take 1 tablet daily by mouth.          Objective:    BP 128/71   Pulse 82   Temp (!) 96.9 F (36.1 C) (Oral)   Ht _1  (1.88 m)   Wt 223 lb (101.2 kg)   BMI 28.63 kg/m   Wt Readings from Last 3 Encounters:  03/03/18 223 lb (101.2 kg)  04/01/17 219 lb (99.3 kg)  09/12/15 224 lb 6.4 oz (101.8 kg)    Physical Exam  Constitutional: He is oriented to person, place, and time. He appears well-developed and well-nourished. No distress.  Eyes: Conjunctivae are normal. No scleral icterus.  Neck: Neck supple. No thyromegaly present.  Cardiovascular: Normal rate, regular rhythm, normal heart sounds and intact distal pulses.  No murmur heard. Pulmonary/Chest: Effort normal and breath sounds normal. No respiratory distress. He has no wheezes.  Musculoskeletal: Normal range of motion. He exhibits no edema.  Lymphadenopathy:    He has no cervical adenopathy.  Neurological: He is alert and oriented to person, place, and time. Coordination normal.  Skin: Skin is warm  and dry. No rash noted. He is not diaphoretic.  Psychiatric: He has a normal mood and affect. His behavior is normal.  Nursing note and vitals reviewed.   Patient had blood work at work on 12/28/2017 which showed a cholesterol panel that was normal and an LDL of 42 and a glucose of 229 and a creatinine of 0.64 and electro lites that were normal and liver panel it was normal and a CBC that was normal and a CRP that was normal and hemoglobin A1c of 8.2 and a B12 of 372 and a PSA of 0.3 and a TSH of 1.65 and a vitamin D of 26, he is Artie started taking supplements for vitamin D.    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 Follow up plan: Return in about 6 months (around 09/02/2018), or if symptoms worsen or fail to improve, for Diabetes recheck.  Counseling provided for all of the vaccine components No orders of the defined types were placed in this encounter.   Caryl Pina, MD [facility] 03/03/2018, 1:46 PM

## 2018-04-19 DIAGNOSIS — E559 Vitamin D deficiency, unspecified: Secondary | ICD-10-CM | POA: Diagnosis not present

## 2018-04-19 DIAGNOSIS — Z716 Tobacco abuse counseling: Secondary | ICD-10-CM | POA: Diagnosis not present

## 2018-04-19 DIAGNOSIS — E782 Mixed hyperlipidemia: Secondary | ICD-10-CM | POA: Diagnosis not present

## 2018-04-19 DIAGNOSIS — E119 Type 2 diabetes mellitus without complications: Secondary | ICD-10-CM | POA: Diagnosis not present

## 2018-04-19 DIAGNOSIS — Z008 Encounter for other general examination: Secondary | ICD-10-CM | POA: Diagnosis not present

## 2018-04-19 DIAGNOSIS — F17210 Nicotine dependence, cigarettes, uncomplicated: Secondary | ICD-10-CM | POA: Diagnosis not present

## 2018-06-09 DIAGNOSIS — Z1389 Encounter for screening for other disorder: Secondary | ICD-10-CM | POA: Diagnosis not present

## 2018-06-09 DIAGNOSIS — E119 Type 2 diabetes mellitus without complications: Secondary | ICD-10-CM | POA: Diagnosis not present

## 2018-06-09 DIAGNOSIS — E782 Mixed hyperlipidemia: Secondary | ICD-10-CM | POA: Diagnosis not present

## 2018-06-09 DIAGNOSIS — Z008 Encounter for other general examination: Secondary | ICD-10-CM | POA: Diagnosis not present

## 2018-06-09 DIAGNOSIS — F17210 Nicotine dependence, cigarettes, uncomplicated: Secondary | ICD-10-CM | POA: Diagnosis not present

## 2018-06-09 DIAGNOSIS — E559 Vitamin D deficiency, unspecified: Secondary | ICD-10-CM | POA: Diagnosis not present

## 2018-06-23 DIAGNOSIS — E782 Mixed hyperlipidemia: Secondary | ICD-10-CM | POA: Diagnosis not present

## 2018-06-23 DIAGNOSIS — E119 Type 2 diabetes mellitus without complications: Secondary | ICD-10-CM | POA: Diagnosis not present

## 2018-06-23 DIAGNOSIS — Z716 Tobacco abuse counseling: Secondary | ICD-10-CM | POA: Diagnosis not present

## 2018-06-23 DIAGNOSIS — F17210 Nicotine dependence, cigarettes, uncomplicated: Secondary | ICD-10-CM | POA: Diagnosis not present

## 2018-06-23 DIAGNOSIS — E559 Vitamin D deficiency, unspecified: Secondary | ICD-10-CM | POA: Diagnosis not present

## 2018-07-07 DIAGNOSIS — I10 Essential (primary) hypertension: Secondary | ICD-10-CM | POA: Diagnosis not present

## 2018-07-07 DIAGNOSIS — Z716 Tobacco abuse counseling: Secondary | ICD-10-CM | POA: Diagnosis not present

## 2018-07-07 DIAGNOSIS — F17210 Nicotine dependence, cigarettes, uncomplicated: Secondary | ICD-10-CM | POA: Diagnosis not present

## 2018-07-26 DIAGNOSIS — E119 Type 2 diabetes mellitus without complications: Secondary | ICD-10-CM | POA: Diagnosis not present

## 2018-07-26 DIAGNOSIS — Z0130 Encounter for examination of blood pressure without abnormal findings: Secondary | ICD-10-CM | POA: Diagnosis not present

## 2018-07-26 DIAGNOSIS — R748 Abnormal levels of other serum enzymes: Secondary | ICD-10-CM | POA: Diagnosis not present

## 2018-07-26 DIAGNOSIS — I10 Essential (primary) hypertension: Secondary | ICD-10-CM | POA: Diagnosis not present

## 2018-08-09 DIAGNOSIS — E119 Type 2 diabetes mellitus without complications: Secondary | ICD-10-CM | POA: Diagnosis not present

## 2018-10-13 ENCOUNTER — Other Ambulatory Visit: Payer: Self-pay

## 2018-10-14 ENCOUNTER — Ambulatory Visit: Payer: BLUE CROSS/BLUE SHIELD | Admitting: Family Medicine

## 2018-10-14 ENCOUNTER — Encounter: Payer: Self-pay | Admitting: Family Medicine

## 2018-10-14 VITALS — BP 134/69 | HR 92 | Temp 97.4°F | Ht 74.0 in | Wt 221.6 lb

## 2018-10-14 DIAGNOSIS — E119 Type 2 diabetes mellitus without complications: Secondary | ICD-10-CM

## 2018-10-14 DIAGNOSIS — E785 Hyperlipidemia, unspecified: Secondary | ICD-10-CM | POA: Diagnosis not present

## 2018-10-14 DIAGNOSIS — E1169 Type 2 diabetes mellitus with other specified complication: Secondary | ICD-10-CM | POA: Diagnosis not present

## 2018-10-14 MED ORDER — SITAGLIPTIN PHOSPHATE 100 MG PO TABS: 100.0000 mg | ORAL_TABLET | Freq: Every day | ORAL | 3 refills | Status: DC

## 2018-10-14 NOTE — Progress Notes (Signed)
BP 134/69   Pulse 92   Temp (!) 97.4 F (36.3 C) (Oral)   Ht 6' 2"  (1.88 m)   Wt 221 lb 9.6 oz (100.5 kg)   BMI 28.45 kg/m    Subjective:   Patient ID: Randy Mcmahon, male    DOB: 1961-04-01, 58 y.o.   MRN: [id number]  HPI: Randy Mcmahon is a 58 y.o. male presenting on 10/14/2018 for Diabetes (check up )   HPI Type 2 diabetes mellitus Patient comes in today for recheck of his diabetes. Patient has been currently taking Jardiance and metformin and Januvia although he has been out of the [place] recently. Patient is currently on an ACE inhibitor/ARB. Patient has not seen an ophthalmologist this year. Patient denies any issues with their feet.   Hyperlipidemia Patient is coming in for recheck of his hyperlipidemia. The patient is currently taking no medication currently. They deny any issues with myalgias or history of liver damage from it. They deny any focal numbness or weakness or chest pain.   Relevant past medical, surgical, family and social history reviewed and updated as indicated. Interim medical history since our last visit reviewed. Allergies and medications reviewed and updated.  Review of Systems  Constitutional: Negative for chills and fever.  Eyes: Negative for visual disturbance.  Respiratory: Negative for shortness of breath and wheezing.   Cardiovascular: Negative for chest pain and leg swelling.  Musculoskeletal: Negative for back pain and gait problem.  Skin: Negative for rash.  Neurological: Negative for dizziness, weakness and light-headedness.  All other systems reviewed and are negative.   Per HPI unless specifically indicated above   Allergies as of 10/14/2018   No Known Allergies     Medication List       Accurate as of Oct 14, 2018  4:18 PM. If you have any questions, ask your nurse or doctor.        STOP taking these medications   rosuvastatin 10 MG tablet Commonly known as:  CRESTOR Stopped by:  Fransisca Kaufmann Dettinger, MD     TAKE  these medications   cholecalciferol 1000 units tablet Commonly known as:  VITAMIN D Take 1,000 Units daily by mouth.   glucose blood test strip Use as instructed   Jardiance 25 MG Tabs tablet Generic drug:  empagliflozin Take 1 tablet by mouth daily at 2 PM.   lisinopril 5 MG tablet Commonly known as:  ZESTRIL Take 1 tablet by mouth daily.   metFORMIN 500 MG tablet Commonly known as:  GLUCOPHAGE Take 1 tablet (500 mg total) by mouth 2 (two) times daily with a meal. What changed:    when to take this  additional instructions   ONE TOUCH ULTRA SYSTEM KIT w/Device Kit 1 kit by Does not apply route once.   OneTouch Delica Lancets 78G Misc   sitaGLIPtin 100 MG tablet Commonly known as:  Januvia Take 1 tablet (100 mg total) by mouth daily.   VITAMIN B 12 PO Take 1 tablet daily by mouth.        Objective:   BP 134/69   Pulse 92   Temp (!) 97.4 F (36.3 C) (Oral)   Ht 6' 2"  (1.88 m)   Wt 221 lb 9.6 oz (100.5 kg)   BMI 28.45 kg/m   Wt Readings from Last 3 Encounters:  10/14/18 221 lb 9.6 oz (100.5 kg)  03/03/18 223 lb (101.2 kg)  04/01/17 219 lb (99.3 kg)    Physical Exam Vitals signs and  nursing note reviewed.  Constitutional:      General: He is not in acute distress.    Appearance: He is well-developed. He is not diaphoretic.  Eyes:     General: No scleral icterus.    Conjunctiva/sclera: Conjunctivae normal.  Neck:     Musculoskeletal: Neck supple.     Thyroid: No thyromegaly.  Cardiovascular:     Rate and Rhythm: Normal rate and regular rhythm.     Heart sounds: Normal heart sounds. No murmur.  Pulmonary:     Effort: Pulmonary effort is normal. No respiratory distress.     Breath sounds: Normal breath sounds. No wheezing.  Musculoskeletal: Normal range of motion.  Lymphadenopathy:     Cervical: No cervical adenopathy.  Skin:    General: Skin is warm and dry.     Findings: No rash.  Neurological:     Mental Status: He is alert and oriented to  person, place, and time.     Coordination: Coordination normal.  Psychiatric:        Behavior: Behavior normal.       Assessment & Plan:   Problem List Items Addressed This Visit      Endocrine   Type 2 diabetes mellitus not at goal [facility]) - Primary   Relevant Medications   lisinopril (ZESTRIL) 5 MG tablet   sitaGLIPtin (JANUVIA) 100 MG tablet   Hyperlipidemia associated with type 2 diabetes mellitus ([place])   Relevant Medications   lisinopril (ZESTRIL) 5 MG tablet   sitaGLIPtin (JANUVIA) 100 MG tablet      ASCVD of 12.9%, will discuss at next visit   Follow up plan: Return in about 3 months (around 01/14/2019) for Diabetes hypertension.  Counseling provided for all of the vaccine components No orders of the defined types were placed in this encounter.   Caryl Pina, MD [facility] 10/14/2018, 4:18 PM

## 2018-11-01 DIAGNOSIS — E782 Mixed hyperlipidemia: Secondary | ICD-10-CM | POA: Diagnosis not present

## 2018-11-01 DIAGNOSIS — E119 Type 2 diabetes mellitus without complications: Secondary | ICD-10-CM | POA: Diagnosis not present

## 2019-01-10 DIAGNOSIS — E782 Mixed hyperlipidemia: Secondary | ICD-10-CM | POA: Diagnosis not present

## 2019-01-10 DIAGNOSIS — Z0130 Encounter for examination of blood pressure without abnormal findings: Secondary | ICD-10-CM | POA: Diagnosis not present

## 2019-01-10 DIAGNOSIS — Z79899 Other long term (current) drug therapy: Secondary | ICD-10-CM | POA: Diagnosis not present

## 2019-01-10 DIAGNOSIS — E119 Type 2 diabetes mellitus without complications: Secondary | ICD-10-CM | POA: Diagnosis not present

## 2019-01-10 DIAGNOSIS — E559 Vitamin D deficiency, unspecified: Secondary | ICD-10-CM | POA: Diagnosis not present

## 2019-01-10 DIAGNOSIS — Z139 Encounter for screening, unspecified: Secondary | ICD-10-CM | POA: Diagnosis not present

## 2019-01-17 DIAGNOSIS — E782 Mixed hyperlipidemia: Secondary | ICD-10-CM | POA: Diagnosis not present

## 2019-01-17 DIAGNOSIS — E119 Type 2 diabetes mellitus without complications: Secondary | ICD-10-CM | POA: Diagnosis not present

## 2019-01-17 DIAGNOSIS — E559 Vitamin D deficiency, unspecified: Secondary | ICD-10-CM | POA: Diagnosis not present

## 2019-01-17 DIAGNOSIS — I10 Essential (primary) hypertension: Secondary | ICD-10-CM | POA: Diagnosis not present

## 2019-04-18 DIAGNOSIS — Z0130 Encounter for examination of blood pressure without abnormal findings: Secondary | ICD-10-CM | POA: Diagnosis not present

## 2019-04-18 DIAGNOSIS — E119 Type 2 diabetes mellitus without complications: Secondary | ICD-10-CM | POA: Diagnosis not present

## 2019-04-18 DIAGNOSIS — Z79899 Other long term (current) drug therapy: Secondary | ICD-10-CM | POA: Diagnosis not present

## 2019-04-18 DIAGNOSIS — E782 Mixed hyperlipidemia: Secondary | ICD-10-CM | POA: Diagnosis not present

## 2019-05-04 DIAGNOSIS — E119 Type 2 diabetes mellitus without complications: Secondary | ICD-10-CM | POA: Diagnosis not present

## 2019-08-18 ENCOUNTER — Ambulatory Visit: Payer: Self-pay | Attending: Internal Medicine

## 2019-08-18 DIAGNOSIS — Z23 Encounter for immunization: Secondary | ICD-10-CM

## 2019-08-18 NOTE — Progress Notes (Signed)
   Covid-19 Vaccination Clinic  Name:  TAEVIN MCFERRAN    MRN: [id number] DOB: 10-29-1960  08/18/2019  Mr. Castilleja was observed post Covid-19 immunization for 15 minutes without incident. He was provided with Vaccine Information Sheet and instruction to access the V-Safe system.   Mr. Blizard was instructed to call 911 with any severe reactions post vaccine: Marland Kitchen Difficulty breathing  . Swelling of face and throat  . A fast heartbeat  . A bad rash all over body  . Dizziness and weakness   Immunizations Administered    Name Date Dose VIS Date Route   Moderna COVID-19 Vaccine 08/18/2019 10:50 AM 0.5 mL 04/19/2019 Intramuscular   Manufacturer: Moderna   Lot: [id number]   NDC: 59741-638-45

## 2019-09-15 ENCOUNTER — Ambulatory Visit: Payer: Self-pay | Attending: Internal Medicine

## 2019-09-15 DIAGNOSIS — Z23 Encounter for immunization: Secondary | ICD-10-CM

## 2019-09-15 NOTE — Progress Notes (Signed)
   Covid-19 Vaccination Clinic  Name:  SALMAN WELLEN    MRN: [id number] DOB: 15-Nov-1960  09/15/2019  Mr. Spellman was observed post Covid-19 immunization for 15 minutes without incident. He was provided with Vaccine Information Sheet and instruction to access the V-Safe system.   Mr. Cormier was instructed to call 911 with any severe reactions post vaccine: Marland Kitchen Difficulty breathing  . Swelling of face and throat  . A fast heartbeat  . A bad rash all over body  . Dizziness and weakness   Immunizations Administered    Name Date Dose VIS Date Route   Moderna COVID-19 Vaccine 09/15/2019 10:16 AM 0.5 mL 04/2019 Intramuscular   Manufacturer: Moderna   Lot: [id number]   NDC: 87199-412-90

## 2019-10-05 DIAGNOSIS — J0100 Acute maxillary sinusitis, unspecified: Secondary | ICD-10-CM | POA: Diagnosis not present

## 2019-10-05 DIAGNOSIS — J029 Acute pharyngitis, unspecified: Secondary | ICD-10-CM | POA: Diagnosis not present

## 2019-10-07 DIAGNOSIS — Z6827 Body mass index (BMI) 27.0-27.9, adult: Secondary | ICD-10-CM | POA: Diagnosis not present

## 2019-10-07 DIAGNOSIS — J029 Acute pharyngitis, unspecified: Secondary | ICD-10-CM | POA: Diagnosis not present

## 2019-10-08 ENCOUNTER — Encounter (HOSPITAL_BASED_OUTPATIENT_CLINIC_OR_DEPARTMENT_OTHER): Payer: Self-pay | Admitting: Emergency Medicine

## 2019-10-08 ENCOUNTER — Other Ambulatory Visit: Payer: Self-pay

## 2019-10-08 ENCOUNTER — Emergency Department (HOSPITAL_BASED_OUTPATIENT_CLINIC_OR_DEPARTMENT_OTHER): Payer: BC Managed Care – PPO

## 2019-10-08 ENCOUNTER — Emergency Department (HOSPITAL_BASED_OUTPATIENT_CLINIC_OR_DEPARTMENT_OTHER)
Admission: EM | Admit: 2019-10-08 | Discharge: 2019-10-08 | Disposition: A | Discharge status: Home / Self Care | Payer: BC Managed Care – PPO | Attending: Emergency Medicine | Admitting: Emergency Medicine

## 2019-10-08 DIAGNOSIS — Z7984 Long term (current) use of oral hypoglycemic drugs: Secondary | ICD-10-CM | POA: Diagnosis not present

## 2019-10-08 DIAGNOSIS — J040 Acute laryngitis: Secondary | ICD-10-CM | POA: Diagnosis not present

## 2019-10-08 DIAGNOSIS — J029 Acute pharyngitis, unspecified: Secondary | ICD-10-CM | POA: Insufficient documentation

## 2019-10-08 DIAGNOSIS — E119 Type 2 diabetes mellitus without complications: Secondary | ICD-10-CM | POA: Diagnosis not present

## 2019-10-08 DIAGNOSIS — Z79899 Other long term (current) drug therapy: Secondary | ICD-10-CM | POA: Insufficient documentation

## 2019-10-08 DIAGNOSIS — F17210 Nicotine dependence, cigarettes, uncomplicated: Secondary | ICD-10-CM | POA: Diagnosis not present

## 2019-10-08 LAB — BASIC METABOLIC PANEL
Anion gap: 10 (ref 5–15)
BUN: 12 mg/dL (ref 6–20)
CO2: 25 mmol/L (ref 22–32)
Calcium: 8.8 mg/dL — ABNORMAL LOW (ref 8.9–10.3)
Chloride: 101 mmol/L (ref 98–111)
Creatinine, Ser: 0.61 mg/dL (ref 0.61–1.24)
GFR calc Af Amer: >60 mL/min (ref >60)
GFR calc non Af Amer: >60 mL/min (ref >60)
Glucose, Bld: 222 mg/dL — ABNORMAL HIGH (ref 70–99)
Potassium: 3.5 mmol/L (ref 3.5–5.1)
Sodium: 136 mmol/L (ref 135–145)

## 2019-10-08 MED ORDER — CLINDAMYCIN PHOSPHATE 600 MG/50ML IV SOLN
600.0000 mg | Freq: Once | INTRAVENOUS | Status: AC
  Administered 2019-10-08: 600 mg via INTRAVENOUS
  Filled 2019-10-08: qty 50

## 2019-10-08 MED ORDER — DEXAMETHASONE SODIUM PHOSPHATE 10 MG/ML IJ SOLN
10.0000 mg | Freq: Once | INTRAMUSCULAR | Status: AC
  Administered 2019-10-08: 10 mg via INTRAVENOUS
  Filled 2019-10-08: qty 1

## 2019-10-08 MED ORDER — CLINDAMYCIN HCL 150 MG PO CAPS
300.0000 mg | ORAL_CAPSULE | Freq: Four times a day (QID) | ORAL | 0 refills | Status: AC
Start: 2019-10-08 — End: 2019-10-15

## 2019-10-08 MED ORDER — IOHEXOL 300 MG/ML  SOLN
100.0000 mL | Freq: Once | INTRAMUSCULAR | Status: AC | PRN
  Administered 2019-10-08: 75 mL via INTRAVENOUS

## 2019-10-08 NOTE — Discharge Instructions (Signed)
You were given a prescription for antibiotics. Please take the antibiotic prescription fully.   Please follow-up with the ear nose and throat doctor, Dr. Pollyann Kennedy, as directed in 2 days.  Return to the emergency department for any fevers, inability to swallow, increased swelling in your mouth, changes in your voice, or any new or worsening symptoms

## 2019-10-08 NOTE — ED Provider Notes (Signed)
[facility] EMERGENCY DEPARTMENT Provider Note   CSN: [id number] Arrival date & time: 10/08/19  1014     History Chief Complaint  Patient presents with  . Sore Throat    Randy Mcmahon is a 59 y.o. male.  HPI   59 year old male with a history of diabetes, hyperlipidemia, who presents the emergency department today for evaluation of a sore throat ongoing for the last 5 days.  He was seen at an urgent care and had a negative strep and Covid test.  He was placed on a Z-Pak and told that he had an upper respiratory infection.  His symptoms have not improved and have worsened.  Has pain to the right side of the throat that is constant and worse when he tries to eat.  He feels like his voice is changed.  He has had no fevers or other upper respiratory symptoms.  Past Medical History:  Diagnosis Date  . Type II or unspecified type diabetes mellitus without mention of complication, uncontrolled 12/31/2012    Patient Active Problem List   Diagnosis Date Noted  . Hyperlipidemia associated with type 2 diabetes mellitus ([place]) 10/14/2018  . Type 2 diabetes mellitus not at goal [facility]) 09/12/2015    Past Surgical History:  Procedure Laterality Date  . APPENDECTOMY  age 42       Family History  Problem Relation Age of Onset  . Alzheimer's disease Mother   . Diabetes Father   . Hypertension Sister   . Colon cancer Neg Hx     Social History   Tobacco Use  . Smoking status: Current Every Day Smoker    Packs/day: 0.50    Years: 15.00    Pack years: 7.50    Types: Cigarettes  . Smokeless tobacco: Never Used  Substance Use Topics  . Alcohol use: No  . Drug use: No    Home Medications Prior to Admission medications   Medication Sig Start Date End Date Taking? Authorizing Provider  Blood Glucose Monitoring Suppl (ONE TOUCH ULTRA SYSTEM KIT) W/DEVICE KIT 1 kit by Does not apply route once. 10/28/12   Hassell Done Mary-Margaret, FNP  cholecalciferol (VITAMIN D) 1000 units  tablet Take 1,000 Units daily by mouth.    [provider]  Cyanocobalamin (VITAMIN B 12 PO) Take 1 tablet daily by mouth.    [provider]  glucose blood test strip Use as instructed 10/28/12   Lysbeth Penner, FNP  JARDIANCE 25 MG TABS tablet Take 1 tablet by mouth daily at 2 PM. 01/21/18   [provider]  lisinopril (ZESTRIL) 5 MG tablet Take 1 tablet by mouth daily. 07/12/18   [provider]  metFORMIN (GLUCOPHAGE) 500 MG tablet Take 1 tablet (500 mg total) by mouth 2 (two) times daily with a meal. Patient taking differently: Take 500 mg by mouth 4 (four) times daily. One tablet at breakfast, one tablet at lunch, two tablets at night 10/21/12   Chipper Herb, MD  [facility] DELICA LANCETS 78G MISC  10/28/12   [provider]  sitaGLIPtin (JANUVIA) 100 MG tablet Take 1 tablet (100 mg total) by mouth daily. 10/14/18   Dettinger, Fransisca Kaufmann, MD    Allergies    Patient has no known allergies.  Review of Systems   Review of Systems  Constitutional: Negative for fever.  HENT: Positive for sore throat. Negative for congestion, ear pain and rhinorrhea.   Eyes: Negative for visual disturbance.  Respiratory: Negative for cough and shortness of  breath.   Cardiovascular: Negative for chest pain.  Gastrointestinal: Negative for abdominal pain.  Genitourinary: Negative for flank pain.  Musculoskeletal: Negative for myalgias.  Skin: Negative for rash.  Neurological: Negative for headaches.  All other systems reviewed and are negative.   Physical Exam Updated Vital Signs BP (!) 142/78 (BP Location: Right Arm)   Pulse 93   Temp 98.6 F (37 C) (Oral)   Resp 18   Ht _0  (1.905 m)   Wt 100.2 kg   SpO2 98%   BMI 27.62 kg/m   Physical Exam Vitals and nursing note reviewed.  Constitutional:      Appearance: He is well-developed.  HENT:     Head: Normocephalic and atraumatic.     Mouth/Throat:     Mouth: Mucous membranes are moist.     Pharynx:  No oropharyngeal exudate or posterior oropharyngeal erythema.     Comments: Right side of the soft palate is swollen, fluctuance and TTP. Uvula is midline. Voice is slightly muffled. There is no trismus.  Eyes:     Conjunctiva/sclera: Conjunctivae normal.  Cardiovascular:     Rate and Rhythm: Normal rate.  Pulmonary:     Effort: Pulmonary effort is normal.  Abdominal:     Palpations: Abdomen is soft.     Tenderness: There is no abdominal tenderness.  Musculoskeletal:     Cervical back: Neck supple.  Skin:    General: Skin is warm and dry.  Neurological:     Mental Status: He is alert.     ED Results / Procedures / Treatments   Labs (all labs ordered are listed, but only abnormal results are displayed) Labs Reviewed  BASIC METABOLIC PANEL - Abnormal; Notable for the following components:      Result Value   Glucose, Bld 222 (*)    Calcium 8.8 (*)    All other components within normal limits    EKG None  Radiology CT Soft Tissue Neck W Contrast  Result Date: 10/08/2019 CLINICAL DATA:  Sore throat 5 days. Right neck pain with concern for peritonsillar abscess. EXAM: CT NECK WITH CONTRAST TECHNIQUE: Multidetector CT imaging of the neck was performed using the standard protocol following the bolus administration of intravenous contrast. CONTRAST:  48m OMNIPAQUE IOHEXOL 300 MG/ML  SOLN COMPARISON:  None. FINDINGS: Pharynx and larynx: Bilateral palatine tonsillar swelling. Submucosal edema in the right oropharynx reaching the hypopharynx and involving the uvula. Low-density at the right puiiform sinus is more likely trapped fluid than an abscess given the elongated appearance. There is a rounded area of low-density above the right tonsillar fossa which measures 2.4 cm, not encapsulated appearing the epiglottis is not thickened but there is right aryepiglottic fold thickening. No high-grade airway stenosis. Salivary glands: No inflammation, mass, or stone. Thyroid: Normal. Lymph nodes:  None enlarged or abnormal density. Vascular: Negative. Limited intracranial: Negative Visualized orbits: Negative Mastoids and visualized paranasal sinuses: Clear Skeleton: Mid to lower cervical disc degeneration. Upper chest: Negative Other: Small subcutaneous low-density in the right neck where there is a scar-like appearance. IMPRESSION: 1. Pharyngitis and supraglottic laryngitis on the right. 2. No convincing abscess. Low-density at the right piriform sinus is more likely luminal secretions and a drainable collection. There is also rounded low-density area above the right tonsillar fossa that is not encapsulated/organized appearing for drainage. Electronically Signed   By: JMonte FantasiaM.D.   On: 10/08/2019 12:48    Procedures Procedures (including critical care time)  Medications Ordered in ED Medications  dexamethasone (DECADRON) injection 10 mg (10 mg Intravenous Given 10/08/19 1052)  clindamycin (CLEOCIN) IVPB 600 mg (0 mg Intravenous Stopped 10/08/19 1200)  iohexol (OMNIPAQUE) 300 MG/ML solution 100 mL (75 mLs Intravenous Contrast Given 10/08/19 1138)    ED Course  I have reviewed the triage vital signs and the nursing notes.  Pertinent labs & imaging results that were available during my care of the patient were reviewed by me and considered in my medical decision making (see chart for details).    MDM Rules/Calculators/A&P                      59 year old man with a 5-day history of sore throat.  Seen in urgent care with negative strep and Covid test.  Presenting here with persistent symptoms after being on a Z-Pak and having no improvement of symptoms.  On exam he has swelling fluctuance and tenderness to the right soft palate.  Uvula is midline.  He is tolerating his secretions his voice is slightly muffled.  We will get CT soft tissue neck, give Decadron, and clindamycin.  BMP with normal cr, elevated glucose but nothing else to suggest dka  CT soft tissue neck personally  reviewed/interpreted -  1. Pharyngitis and supraglottic laryngitis on the right. 2. No convincing abscess. Low-density at the right piriform sinus is more likely luminal secretions and a drainable collection. There is also rounded low-density area above the right tonsillar fossa that is not encapsulated/organized appearing for drainage.   1:23 PM CONSULT with Dr. Constance Holster with ENT who recommended placing the patient on clindamycin and having him f/u in the office Monday, 5/24.   Pt given decadron and clinda in the ED. He does not appear dehydrated and has been tolerating PO at home. Dr. Roslynn Amble evaluated the patient and discussed plan to place patient on clindamycin and follow-up with ENT.  Patient given information about return precautions.  Is in agreement with plan.  Discharged in stable condition.  Final Clinical Impression(s) / ED Diagnoses Final diagnoses:  None    Rx / DC Orders ED Discharge Orders    None       Rodney Booze, PA-C 10/08/19 1329    Lucrezia Starch, MD 10/09/19 [phone]

## 2019-10-08 NOTE — ED Notes (Signed)
Pt tolerating po fluids without difficulty.  Does report some continued discomfort with swallowing.

## 2019-10-08 NOTE — ED Triage Notes (Addendum)
R side sore throat since Tuesday. Was seen at [facility] with neg strep and covid tests. He has a muffled voice. Has been taking abx

## 2019-10-09 ENCOUNTER — Other Ambulatory Visit: Payer: Self-pay

## 2019-10-09 ENCOUNTER — Emergency Department (HOSPITAL_BASED_OUTPATIENT_CLINIC_OR_DEPARTMENT_OTHER)
Admission: EM | Admit: 2019-10-09 | Discharge: 2019-10-09 | Disposition: A | Discharge status: Home / Self Care | Payer: BC Managed Care – PPO | Attending: Emergency Medicine | Admitting: Emergency Medicine

## 2019-10-09 ENCOUNTER — Encounter (HOSPITAL_BASED_OUTPATIENT_CLINIC_OR_DEPARTMENT_OTHER): Payer: Self-pay | Admitting: Emergency Medicine

## 2019-10-09 DIAGNOSIS — J029 Acute pharyngitis, unspecified: Secondary | ICD-10-CM | POA: Diagnosis not present

## 2019-10-09 DIAGNOSIS — F17210 Nicotine dependence, cigarettes, uncomplicated: Secondary | ICD-10-CM | POA: Diagnosis not present

## 2019-10-09 DIAGNOSIS — J36 Peritonsillar abscess: Secondary | ICD-10-CM | POA: Diagnosis not present

## 2019-10-09 DIAGNOSIS — Z79899 Other long term (current) drug therapy: Secondary | ICD-10-CM | POA: Insufficient documentation

## 2019-10-09 DIAGNOSIS — Z7984 Long term (current) use of oral hypoglycemic drugs: Secondary | ICD-10-CM | POA: Diagnosis not present

## 2019-10-09 DIAGNOSIS — K122 Cellulitis and abscess of mouth: Secondary | ICD-10-CM

## 2019-10-09 DIAGNOSIS — E119 Type 2 diabetes mellitus without complications: Secondary | ICD-10-CM | POA: Diagnosis not present

## 2019-10-09 DIAGNOSIS — Z20822 Contact with and (suspected) exposure to covid-19: Secondary | ICD-10-CM | POA: Insufficient documentation

## 2019-10-09 LAB — CBC WITH DIFFERENTIAL/PLATELET
Abs Immature Granulocytes: 0.05 10*3/uL (ref 0.00–0.07)
Basophils Absolute: 0 10*3/uL (ref 0.0–0.1)
Basophils Relative: 0 %
Eosinophils Absolute: 0 10*3/uL (ref 0.0–0.5)
Eosinophils Relative: 0 %
HCT: 40.3 % (ref 39.0–52.0)
Hemoglobin: 13.9 g/dL (ref 13.0–17.0)
Immature Granulocytes: 1 %
Lymphocytes Relative: 14 %
Lymphs Abs: 1.6 10*3/uL (ref 0.7–4.0)
MCH: 29.7 pg (ref 26.0–34.0)
MCHC: 34.5 g/dL (ref 30.0–36.0)
MCV: 86.1 fL (ref 80.0–100.0)
Monocytes Absolute: 0.9 10*3/uL (ref 0.1–1.0)
Monocytes Relative: 8 %
Neutro Abs: 8.6 10*3/uL — ABNORMAL HIGH (ref 1.7–7.7)
Neutrophils Relative %: 77 %
Platelets: 135 10*3/uL — ABNORMAL LOW (ref 150–400)
RBC: 4.68 MIL/uL (ref 4.22–5.81)
RDW: 12.5 % (ref 11.5–15.5)
WBC: 11.1 10*3/uL — ABNORMAL HIGH (ref 4.0–10.5)
nRBC: 0 % (ref 0.0–0.2)

## 2019-10-09 LAB — BASIC METABOLIC PANEL
Anion gap: 12 (ref 5–15)
BUN: 14 mg/dL (ref 6–20)
CO2: 25 mmol/L (ref 22–32)
Calcium: 9 mg/dL (ref 8.9–10.3)
Chloride: 104 mmol/L (ref 98–111)
Creatinine, Ser: 0.63 mg/dL (ref 0.61–1.24)
GFR calc Af Amer: >60 mL/min (ref >60)
GFR calc non Af Amer: >60 mL/min (ref >60)
Glucose, Bld: 241 mg/dL — ABNORMAL HIGH (ref 70–99)
Potassium: 3.8 mmol/L (ref 3.5–5.1)
Sodium: 141 mmol/L (ref 135–145)

## 2019-10-09 LAB — SARS CORONAVIRUS 2 BY RT PCR (HOSPITAL ORDER, PERFORMED IN ~~LOC~~ HOSPITAL LAB): SARS Coronavirus 2: NEGATIVE

## 2019-10-09 MED ORDER — MORPHINE SULFATE (PF) 4 MG/ML IV SOLN
4.0000 mg | Freq: Once | INTRAVENOUS | Status: AC
  Administered 2019-10-09: 4 mg via INTRAVENOUS
  Filled 2019-10-09: qty 1

## 2019-10-09 MED ORDER — IBUPROFEN 100 MG/5ML PO SUSP
ORAL | Status: AC
  Filled 2019-10-09: qty 30

## 2019-10-09 MED ORDER — PROMETHAZINE HCL 25 MG RE SUPP
25.0000 mg | Freq: Four times a day (QID) | RECTAL | 1 refills | Status: DC | PRN
Start: 2019-10-09 — End: 2020-02-13

## 2019-10-09 MED ORDER — HYDROCODONE-ACETAMINOPHEN 7.5-325 MG PO TABS: 1.0000 | ORAL_TABLET | Freq: Four times a day (QID) | ORAL | 0 refills | Status: DC | PRN

## 2019-10-09 MED ORDER — IBUPROFEN 100 MG/5ML PO SUSP: 600.0000 mg | Freq: Once | ORAL | Status: DC

## 2019-10-09 MED ORDER — SODIUM CHLORIDE 0.9 % IV BOLUS
1000.0000 mL | Freq: Once | INTRAVENOUS | Status: AC
  Administered 2019-10-09: 1000 mL via INTRAVENOUS

## 2019-10-09 MED ORDER — CLINDAMYCIN PHOSPHATE 600 MG/50ML IV SOLN
600.0000 mg | Freq: Once | INTRAVENOUS | Status: AC
  Administered 2019-10-09: 600 mg via INTRAVENOUS
  Filled 2019-10-09: qty 50

## 2019-10-09 MED ORDER — IBUPROFEN 400 MG PO TABS: 600.0000 mg | ORAL_TABLET | Freq: Once | ORAL | Status: DC

## 2019-10-09 MED ORDER — DEXAMETHASONE SODIUM PHOSPHATE 10 MG/ML IJ SOLN
10.0000 mg | Freq: Once | INTRAMUSCULAR | Status: AC
  Administered 2019-10-09: 10 mg via INTRAVENOUS
  Filled 2019-10-09: qty 1

## 2019-10-09 NOTE — Discharge Instructions (Addendum)
Continue taking your antibiotics as prescribed and finish the entire course.  Dr. Pollyann Kennedy prescribed you additional pain medication to take as needed. You can take up to 800 mg Ibuprofen (4 OTC tablets) every 8 hours as needed for pain and then if you continue to have pain you can take the narcotic pain medication.  I would recommend contacting the office end of day tomorrow if you have not heard from them about a follow up appointment Return to the ED for any worsening symptoms

## 2019-10-09 NOTE — ED Notes (Signed)
ED Provider at bedside. 

## 2019-10-09 NOTE — Consult Note (Signed)
Reason for Consult: Sore throat Referring Physician: Wynetta Fines, MD  Randy Mcmahon is an 59 y.o. male.  HPI: 5-day history of right-sided sore throat.  Initially started on Z-Pak then switched over to clindamycin.  Continues to get worse.  Transferred over here from med center at [facility].  CT scan reveals a peritonsillar abscess on the right.  No prior history of pharyngeal problems.  Past Medical History:  Diagnosis Date  . Type II or unspecified type diabetes mellitus without mention of complication, uncontrolled 12/31/2012    Past Surgical History:  Procedure Laterality Date  . APPENDECTOMY  age 54    Family History  Problem Relation Age of Onset  . Alzheimer's disease Mother   . Diabetes Father   . Hypertension Sister   . Colon cancer Neg Hx     Social History:  reports that he has been smoking cigarettes. He has a 7.50 pack-year smoking history. He has never used smokeless tobacco. He reports that he does not drink alcohol or use drugs.  Allergies: No Known Allergies  Medications: Reviewed  Results for orders placed or performed during the hospital encounter of 10/09/19 (from the past 48 hour(s))  CBC with Differential     Status: Abnormal   Collection Time: 10/09/19  9:28 AM  Result Value Ref Range   WBC 11.1 (H) 4.0 - 10.5 K/uL   RBC 4.68 4.22 - 5.81 MIL/uL   Hemoglobin 13.9 13.0 - 17.0 g/dL   HCT 59.5 63.8 - 75.6 %   MCV 86.1 80.0 - 100.0 fL   MCH 29.7 26.0 - 34.0 pg   MCHC 34.5 30.0 - 36.0 g/dL   RDW 43.3 29.5 - 18.8 %   Platelets 135 (L) 150 - 400 K/uL   nRBC 0.0 0.0 - 0.2 %   Neutrophils Relative % 77 %   Neutro Abs 8.6 (H) 1.7 - 7.7 K/uL   Lymphocytes Relative 14 %   Lymphs Abs 1.6 0.7 - 4.0 K/uL   Monocytes Relative 8 %   Monocytes Absolute 0.9 0.1 - 1.0 K/uL   Eosinophils Relative 0 %   Eosinophils Absolute 0.0 0.0 - 0.5 K/uL   Basophils Relative 0 %   Basophils Absolute 0.0 0.0 - 0.1 K/uL   Immature Granulocytes 1 %   Abs Immature  Granulocytes 0.05 0.00 - 0.07 K/uL    Comment: Performed at [facility], [address] [place], [place] [address]  Basic metabolic panel     Status: Abnormal   Collection Time: 10/09/19  9:28 AM  Result Value Ref Range   Sodium 141 135 - 145 mmol/L   Potassium 3.8 3.5 - 5.1 mmol/L   Chloride 104 98 - 111 mmol/L   CO2 25 22 - 32 mmol/L   Glucose, Bld 241 (H) 70 - 99 mg/dL    Comment: Glucose reference range applies only to samples taken after fasting for at least 8 hours.   BUN 14 6 - 20 mg/dL   Creatinine, Ser 6.30 0.61 - 1.24 mg/dL   Calcium 9.0 8.9 - 16.0 mg/dL   GFR calc non Af Amer >60 >60 mL/min   GFR calc Af Amer >60 >60 mL/min   Anion gap 12 5 - 15    Comment: Performed at [facility], [address] [place], [place] [address]  SARS Coronavirus 2 by RT PCR (hospital order, performed in [facility] hospital lab) Nasopharyngeal Nasopharyngeal Swab     Status: None  Collection Time: 10/09/19  9:29 AM   Specimen: Nasopharyngeal Swab  Result Value Ref Range   SARS Coronavirus 2 NEGATIVE NEGATIVE    Comment: (NOTE) SARS-CoV-2 target nucleic acids are NOT DETECTED. The SARS-CoV-2 RNA is generally detectable in upper and lower respiratory specimens during the acute phase of infection. The lowest concentration of SARS-CoV-2 viral copies this assay can detect is 250 copies / mL. A negative result does not preclude SARS-CoV-2 infection and should not be used as the sole basis for treatment or other patient management decisions.  A negative result may occur with improper specimen collection / handling, submission of specimen other than nasopharyngeal swab, presence of viral mutation(s) within the areas targeted by this assay, and inadequate number of viral copies (<250 copies / mL). A negative result must be combined with clinical observations, patient history, and epidemiological information. Fact Sheet for Patients:     StrictlyIdeas.no Fact Sheet for Healthcare Providers: BankingDealers.co.za This test is not yet approved or cleared  by the [place] FDA and has been authorized for detection and/or diagnosis of SARS-CoV-2 by FDA under an Emergency Use Authorization (EUA).  This EUA will remain in effect (meaning this test can be used) for the duration of the COVID-19 declaration under Section 564(b)(1) of the Act, 21 U.S.C. section 360bbb-3(b)(1), unless the authorization is terminated or revoked sooner. Performed at [facility], [address], [place], [place] [address]     CT Soft Tissue Neck W Contrast  Result Date: 10/08/2019 CLINICAL DATA:  Sore throat 5 days. Right neck pain with concern for peritonsillar abscess. EXAM: CT NECK WITH CONTRAST TECHNIQUE: Multidetector CT imaging of the neck was performed using the standard protocol following the bolus administration of intravenous contrast. CONTRAST:  34mL OMNIPAQUE IOHEXOL 300 MG/ML  SOLN COMPARISON:  None. FINDINGS: Pharynx and larynx: Bilateral palatine tonsillar swelling. Submucosal edema in the right oropharynx reaching the hypopharynx and involving the uvula. Low-density at the right puiiform sinus is more likely trapped fluid than an abscess given the elongated appearance. There is a rounded area of low-density above the right tonsillar fossa which measures 2.4 cm, not encapsulated appearing the epiglottis is not thickened but there is right aryepiglottic fold thickening. No high-grade airway stenosis. Salivary glands: No inflammation, mass, or stone. Thyroid: Normal. Lymph nodes: None enlarged or abnormal density. Vascular: Negative. Limited intracranial: Negative Visualized orbits: Negative Mastoids and visualized paranasal sinuses: Clear Skeleton: Mid to lower cervical disc degeneration. Upper chest: Negative Other: Small subcutaneous low-density in the right neck where there is a  scar-like appearance. IMPRESSION: 1. Pharyngitis and supraglottic laryngitis on the right. 2. No convincing abscess. Low-density at the right piriform sinus is more likely luminal secretions and a drainable collection. There is also rounded low-density area above the right tonsillar fossa that is not encapsulated/organized appearing for drainage. Electronically Signed   By: Monte Fantasia M.D.   On: 10/08/2019 12:48    ZYS:[id number] except as listed in admit H&P  Blood pressure (!) 143/72, pulse 87, temperature 98.3 F (36.8 C), temperature source Oral, resp. rate 18, SpO2 100 %.  PHYSICAL EXAM: Overall appearance:  Healthy appearing, in no distress Head:  Normocephalic, atraumatic. Ears: External ears look healthy. Nose: External nose is healthy in appearance. Internal nasal exam free of any lesions or obstruction. Oral Cavity/Pharynx: There is no trismus.  There is an obvious soft tissue swelling and fluctuance of the right side soft palate with exudate on the surface of the right tonsil. Larynx/Hypopharynx:  Deferred Neuro:  No identifiable neurologic deficits. Neck: No palpable neck masses.  He is slightly tender along the right level 2 nodes.  Studies Reviewed: CT scan reviewed.    Procedures: Incision and drainage of right peritonsillar abscess.  1% Xylocaine with epinephrine was infiltrated into the right side soft palate.  Number 18-gauge needle was used to aspirate about 3 cc of pus from the right peritonsillar space.  #15 scalpel was used to incise the mucosa and tonsil hemostat was used to open up into the abscess cavity.  All secretions and pus was suctioned out.  He tolerated this well.   Assessment/Plan: Right peritonsillar abscess, incision and drainage performed.  Large amount of pus obtained.  Anticipate a significant improvement in the next 24-36 hours.  Continue on clindamycin and complete the course.  Will prescribe some pain medicine.  May go home and follow-up with [place]  in the clinic during the week.  Serena Colonel 10/09/2019, 12:29 PM

## 2019-10-09 NOTE — ED Provider Notes (Addendum)
[facility] EMERGENCY DEPARTMENT Provider Note   CSN: [id number] Arrival date & time: 10/09/19  4268     History Chief Complaint  Patient presents with  . Sore Throat    Randy Mcmahon is a 59 y.o. male.  HPI   59 year old male with a history of diabetes, hyperlipidemia, presenting to the ED for evaluation of a sore throat.  I actually saw this patient in the ED yesterday where he underwent CT soft tissue of the neck that showed a rounded area of low density above the right tonsillar fossa which did not appear encapsulated.  He was given IV antibiotics in the ED as well as Decadron.  ENT was consulted and he was recommended to follow-up in 48 hours in the office.  He was discharged on clindamycin.  States that he took 3 more doses of clindamycin last night and one this morning but his symptoms seem to have worsened this morning and he feels like his throat is more swollen.  He has tried to take p.o. at home however it has been difficult to swallow.  He is able to tolerate his own secretions.  He has not had any fevers.  Past Medical History:  Diagnosis Date  . Type II or unspecified type diabetes mellitus without mention of complication, uncontrolled 12/31/2012    Patient Active Problem List   Diagnosis Date Noted  . Hyperlipidemia associated with type 2 diabetes mellitus ([place]) 10/14/2018  . Type 2 diabetes mellitus not at goal [facility]) 09/12/2015    Past Surgical History:  Procedure Laterality Date  . APPENDECTOMY  age 66       Family History  Problem Relation Age of Onset  . Alzheimer's disease Mother   . Diabetes Father   . Hypertension Sister   . Colon cancer Neg Hx     Social History   Tobacco Use  . Smoking status: Current Every Day Smoker    Packs/day: 0.50    Years: 15.00    Pack years: 7.50    Types: Cigarettes  . Smokeless tobacco: Never Used  Substance Use Topics  . Alcohol use: No  . Drug use: No    Home Medications Prior to Admission  medications   Medication Sig Start Date End Date Taking? Authorizing Provider  Blood Glucose Monitoring Suppl (ONE TOUCH ULTRA SYSTEM KIT) W/DEVICE KIT 1 kit by Does not apply route once. 10/28/12   Hassell Done Mary-Kentley Cedillo, FNP  cholecalciferol (VITAMIN D) 1000 units tablet Take 1,000 Units daily by mouth.    [provider]  clindamycin (CLEOCIN) 150 MG capsule Take 2 capsules (300 mg total) by mouth 4 (four) times daily for 7 days. 10/08/19 10/15/19  Lucrezia Starch, MD  Cyanocobalamin (VITAMIN B 12 PO) Take 1 tablet daily by mouth.    [provider]  glucose blood test strip Use as instructed 10/28/12   Lysbeth Penner, FNP  JARDIANCE 25 MG TABS tablet Take 1 tablet by mouth daily at 2 PM. 01/21/18   [provider]  lisinopril (ZESTRIL) 5 MG tablet Take 1 tablet by mouth daily. 07/12/18   [provider]  metFORMIN (GLUCOPHAGE) 500 MG tablet Take 1 tablet (500 mg total) by mouth 2 (two) times daily with a meal. Patient taking differently: Take 500 mg by mouth 4 (four) times daily. One tablet at breakfast, one tablet at lunch, two tablets at night 10/21/12   Chipper Herb, MD  [facility] LANCETS 34H MISC  10/28/12   [provider]  sitaGLIPtin (JANUVIA) 100 MG tablet Take 1 tablet (100 mg total) by mouth daily. 10/14/18   Dettinger, Fransisca Kaufmann, MD    Allergies    Patient has no known allergies.  Review of Systems   Review of Systems  Constitutional: Negative for chills and fever.  HENT: Positive for sore throat, trouble swallowing and voice change.   Eyes: Negative for visual disturbance.  Respiratory: Negative for cough and shortness of breath.   Cardiovascular: Negative for chest pain.  Gastrointestinal: Negative for abdominal pain, nausea and vomiting.  Genitourinary: Negative for dysuria and hematuria.  Musculoskeletal: Negative for back pain.  Skin: Negative for color change and rash.  Neurological: Negative for headaches.  All other  systems reviewed and are negative.   Physical Exam Updated Vital Signs BP (!) 155/80 (BP Location: Right Arm)   Pulse 83   Temp 98 F (36.7 C) (Oral)   Resp 20   SpO2 99%   Physical Exam Vitals and nursing note reviewed.  Constitutional:      Appearance: He is well-developed.  HENT:     Head: Normocephalic and atraumatic.     Mouth/Throat:     Mouth: Mucous membranes are moist.     Pharynx: Uvula midline. Posterior oropharyngeal erythema and uvula swelling present. No oropharyngeal exudate.     Tonsils: No tonsillar exudate. 1+ on the right. 1+ on the left.     Comments: 2cm area of swelling, fluctuance and TTP to the right soft palate (appears worse compared to exam yesterday) Eyes:     Conjunctiva/sclera: Conjunctivae normal.  Cardiovascular:     Rate and Rhythm: Normal rate and regular rhythm.     Heart sounds: No murmur.  Pulmonary:     Effort: Pulmonary effort is normal. No respiratory distress.     Breath sounds: Normal breath sounds.  Abdominal:     Palpations: Abdomen is soft.     Tenderness: There is no abdominal tenderness.  Musculoskeletal:     Cervical back: Neck supple.  Skin:    General: Skin is warm and dry.  Neurological:     Mental Status: He is alert.     ED Results / Procedures / Treatments   Labs (all labs ordered are listed, but only abnormal results are displayed) Labs Reviewed  CBC WITH DIFFERENTIAL/PLATELET - Abnormal; Notable for the following components:      Result Value   WBC 11.1 (*)    Platelets 135 (*)    Neutro Abs 8.6 (*)    All other components within normal limits  BASIC METABOLIC PANEL - Abnormal; Notable for the following components:   Glucose, Bld 241 (*)    All other components within normal limits  SARS CORONAVIRUS 2 BY RT PCR (HOSPITAL ORDER, [facility])    EKG None  Radiology CT Soft Tissue Neck W Contrast  Result Date: 10/08/2019 CLINICAL DATA:  Sore throat 5 days. Right neck pain  with concern for peritonsillar abscess. EXAM: CT NECK WITH CONTRAST TECHNIQUE: Multidetector CT imaging of the neck was performed using the standard protocol following the bolus administration of intravenous contrast. CONTRAST:  12m OMNIPAQUE IOHEXOL 300 MG/ML  SOLN COMPARISON:  None. FINDINGS: Pharynx and larynx: Bilateral palatine tonsillar swelling. Submucosal edema in the right oropharynx reaching the hypopharynx and involving the uvula. Low-density at the right puiiform sinus is more likely trapped fluid than an abscess given the elongated appearance. There is a rounded area of low-density above the right  tonsillar fossa which measures 2.4 cm, not encapsulated appearing the epiglottis is not thickened but there is right aryepiglottic fold thickening. No high-grade airway stenosis. Salivary glands: No inflammation, mass, or stone. Thyroid: Normal. Lymph nodes: None enlarged or abnormal density. Vascular: Negative. Limited intracranial: Negative Visualized orbits: Negative Mastoids and visualized paranasal sinuses: Clear Skeleton: Mid to lower cervical disc degeneration. Upper chest: Negative Other: Small subcutaneous low-density in the right neck where there is a scar-like appearance. IMPRESSION: 1. Pharyngitis and supraglottic laryngitis on the right. 2. No convincing abscess. Low-density at the right piriform sinus is more likely luminal secretions and a drainable collection. There is also rounded low-density area above the right tonsillar fossa that is not encapsulated/organized appearing for drainage. Electronically Signed   By: Monte Fantasia M.D.   On: 10/08/2019 12:48    Procedures Procedures (including critical care time)  Medications Ordered in ED Medications  ibuprofen (ADVIL) 100 MG/5ML suspension 600 mg ( Oral Not Given 10/09/19 0948)  clindamycin (CLEOCIN) IVPB 600 mg (0 mg Intravenous Stopped 10/09/19 1016)  dexamethasone (DECADRON) injection 10 mg (10 mg Intravenous Given 10/09/19 0936)   morphine 4 MG/ML injection 4 mg (4 mg Intravenous Given 10/09/19 0952)  sodium chloride 0.9 % bolus 1,000 mL (1,000 mLs Intravenous New Bag/Given 10/09/19 0955)    ED Course  I have reviewed the triage vital signs and the nursing notes.  Pertinent labs & imaging results that were available during my care of the patient were reviewed by me and considered in my medical decision making (see chart for details).    MDM Rules/Calculators/A&P                      59 year old male presenting for evaluation of a sore throat ongoing for about 5 to 6 days.  Was evaluated in the ED yesterday and CT scan did not show a drainable abscess at that time.  He was given IV antibiotics and antibiotics for home.  He was also given Decadron.  He was discharged with recommendations to follow-up with ENT in 48 hours.  He presents today with worsening swelling and difficulty swallowing.  He is afebrile, nontoxic-appearing.  No tachycardia or hypotension.  The swelling to the soft palate does appear worse from my exam yesterday and his voice is still sounding muffled.  He is tolerating his secretions.  No evidence of airway compromise at this time.  Will order labs, COVID test, give clindamycin, Decadron, pain medications, IVF and reconsult ENT.  CBC with mild leukocytosis. No anemia. BMP with elevated blood glucose, otherwise wnl.  COVID negative   9:37 AM Discussed case with Dr. Constance Holster with ENT. He recommended sending the patient to the [facility] ED and he will evaluate the patient in the ED.   9:39 AM Discussed case with Dr. Regenia Skeeter, EDP, who accepts patient for transfer.   9:42 AM Updated [facility] that pt will need transport. They are aware and will organize this.    10:30 AM Pt transferred in stable condition.  Final Clinical Impression(s) / ED Diagnoses Final diagnoses:  Abscess of oral tissue    Rx / DC Orders ED Discharge Orders    None       Rodney Booze, PA-C 10/09/19 1016    Couture,  [place], PA-C 10/09/19 1035    Lucrezia Starch, MD 10/12/19 1006

## 2019-10-09 NOTE — ED Triage Notes (Signed)
Pt presents from urgent care with [facility] for a sore throat that started Wednesday. Initially treated with PO abx, returned to UC for worsening sx, given IV abx (clindamycin) and sent to ED today for evaluation for possible abscess by an ENT. Positive diff swallowing. Denies drooling, difficulty breathing. Neg covid, neg strep at UC  H/o DM, last CBG at 0930 - 231mg /dL

## 2019-10-09 NOTE — ED Notes (Signed)
Patient verbalizes understanding of discharge instructions. Opportunity for questioning and answers were provided. Armband removed by staff, pt discharged from ED to home 

## 2019-10-09 NOTE — ED Provider Notes (Signed)
Pt presents to the ED transferred from [facility] for evaluation of sore throat x 5 days. Pt was initially seen at an UC earlier this week where he tested negative for strep and covid. He was placed on a zpack without improvement in his symptoms. Pt then went to [facility] ED yesterday for same and had a CT scan done with: IMPRESSION: 1. Pharyngitis and supraglottic laryngitis on the right. 2. No convincing abscess. Low-density at the right piriform sinus is more likely luminal secretions and a drainable collection. There is also rounded low-density area above the right tonsillar fossa that is not encapsulated/organized appearing for drainage.  ENT was consulted and recommend clindamycin and outpatient follow up on Monday 05/24. Pt then returned to [facility] ED today with complaints of worsening swelling in his throat. Pt was given another dose of clindamycin and decadron in the ED as well as fluids. ENT was reconsulted who recommended transfer to [facility] for further evaluation.   Physical Exam  BP (!) 143/72 (BP Location: Right Arm)   Pulse 87   Temp 98.3 F (36.8 C) (Oral)   Resp 18   SpO2 100%   Physical Exam Vitals and nursing note reviewed.  Constitutional:      Appearance: He is not ill-appearing.  HENT:     Head: Normocephalic and atraumatic.     Mouth/Throat:     Mouth: Mucous membranes are moist.     Comments: Right soft palate with edema, fluctuance, and TTP. Uvula is midline. Voice sounds muffled on exam.  Eyes:     Conjunctiva/sclera: Conjunctivae normal.  Cardiovascular:     Rate and Rhythm: Normal rate and regular rhythm.     Heart sounds: Normal heart sounds.  Pulmonary:     Effort: Pulmonary effort is normal.     Breath sounds: Normal breath sounds. No wheezing, rhonchi or rales.  Skin:    General: Skin is warm and dry.     Coloration: Skin is not jaundiced.  Neurological:     Mental Status: He is alert.     ED Course/Procedures     Procedures Results for orders placed or  performed during the hospital encounter of 10/09/19  SARS Coronavirus 2 by RT PCR (hospital order, performed in [facility] hospital lab) Nasopharyngeal Nasopharyngeal Swab   Specimen: Nasopharyngeal Swab  Result Value Ref Range   SARS Coronavirus 2 NEGATIVE NEGATIVE  CBC with Differential  Result Value Ref Range   WBC 11.1 (H) 4.0 - 10.5 K/uL   RBC 4.68 4.22 - 5.81 MIL/uL   Hemoglobin 13.9 13.0 - 17.0 g/dL   HCT 40.3 39.0 - 52.0 %   MCV 86.1 80.0 - 100.0 fL   MCH 29.7 26.0 - 34.0 pg   MCHC 34.5 30.0 - 36.0 g/dL   RDW 12.5 11.5 - 15.5 %   Platelets 135 (L) 150 - 400 K/uL   nRBC 0.0 0.0 - 0.2 %   Neutrophils Relative % 77 %   Neutro Abs 8.6 (H) 1.7 - 7.7 K/uL   Lymphocytes Relative 14 %   Lymphs Abs 1.6 0.7 - 4.0 K/uL   Monocytes Relative 8 %   Monocytes Absolute 0.9 0.1 - 1.0 K/uL   Eosinophils Relative 0 %   Eosinophils Absolute 0.0 0.0 - 0.5 K/uL   Basophils Relative 0 %   Basophils Absolute 0.0 0.0 - 0.1 K/uL   Immature Granulocytes 1 %   Abs Immature Granulocytes 0.05 0.00 - 0.07 K/uL  Basic metabolic panel  Result Value Ref  Range   Sodium 141 135 - 145 mmol/L   Potassium 3.8 3.5 - 5.1 mmol/L   Chloride 104 98 - 111 mmol/L   CO2 25 22 - 32 mmol/L   Glucose, Bld 241 (H) 70 - 99 mg/dL   BUN 14 6 - 20 mg/dL   Creatinine, Ser 7.86 0.61 - 1.24 mg/dL   Calcium 9.0 8.9 - 76.7 mg/dL   GFR calc non Af Amer >60 >60 mL/min   GFR calc Af Amer >60 >60 mL/min   Anion gap 12 5 - 15    MDM  Consulted Dr. Pollyann Kennedy ENT who will come evaluate patient at the bedside.   ENT performed I&D at bedside. Discharged home with additional pain medication. Pt to follow up in the clinic next week.      Tanda Rockers, PA-C 10/09/19 1247    Wynetta Fines, MD 10/10/19 1535

## 2019-10-09 NOTE — ED Triage Notes (Signed)
R side sore throat, was seen yesterday and given IV abx. Reports the swelling has worsened.

## 2019-10-09 NOTE — ED Notes (Signed)
ENT card at bedside.

## 2019-11-28 ENCOUNTER — Encounter: Payer: Self-pay | Admitting: Family Medicine

## 2020-02-13 ENCOUNTER — Encounter: Payer: Self-pay | Admitting: Family Medicine

## 2020-02-13 ENCOUNTER — Ambulatory Visit: Payer: BC Managed Care – PPO | Admitting: Family Medicine

## 2020-02-13 ENCOUNTER — Other Ambulatory Visit: Payer: Self-pay

## 2020-02-13 VITALS — BP 137/81 | HR 94 | Temp 97.0°F | Ht 75.0 in | Wt 226.0 lb

## 2020-02-13 DIAGNOSIS — E785 Hyperlipidemia, unspecified: Secondary | ICD-10-CM

## 2020-02-13 DIAGNOSIS — E1169 Type 2 diabetes mellitus with other specified complication: Secondary | ICD-10-CM | POA: Diagnosis not present

## 2020-02-13 DIAGNOSIS — R739 Hyperglycemia, unspecified: Secondary | ICD-10-CM

## 2020-02-13 DIAGNOSIS — E119 Type 2 diabetes mellitus without complications: Secondary | ICD-10-CM | POA: Diagnosis not present

## 2020-02-13 LAB — BAYER DCA HB A1C WAIVED: HB A1C (BAYER DCA - WAIVED): 8.3 % — ABNORMAL HIGH (ref <7.0)

## 2020-02-13 MED ORDER — DAPAGLIFLOZIN PROPANEDIOL 10 MG PO TABS
10.0000 mg | ORAL_TABLET | Freq: Every day | ORAL | 3 refills | Status: DC
Start: 2020-02-13 — End: 2021-02-11

## 2020-02-13 MED ORDER — METFORMIN HCL 500 MG PO TABS: 500.0000 mg | ORAL_TABLET | Freq: Two times a day (BID) | ORAL | 1 refills | Status: DC

## 2020-02-13 MED ORDER — DAPAGLIFLOZIN PROPANEDIOL 10 MG PO TABS
10.0000 mg | ORAL_TABLET | Freq: Every day | ORAL | 0 refills | Status: DC
Start: 2020-02-13 — End: 2021-02-11

## 2020-02-13 NOTE — Progress Notes (Signed)
BP 137/81   Pulse 94   Temp (!) 97 F (36.1 C)   Ht 6' 3"  (1.905 m)   Wt 226 lb (102.5 kg)   SpO2 98%   BMI 28.25 kg/m    Subjective:   Patient ID: Randy Mcmahon, male    DOB: Oct 12, 1960, 59 y.o.   MRN: [id number]  HPI: Randy Mcmahon is a 59 y.o. male presenting on 02/13/2020 for Medical Management of Chronic Issues and Diabetes   HPI Type 2 diabetes mellitus Patient comes in today for recheck of his diabetes. Patient has been currently taking glimepiride and Metformin. Patient is currently on an ACE inhibitor/ARB. Patient has not seen an ophthalmologist this year. Patient denies any issues with their feet. The symptom started onset as an adult hypertension and hyperlipidemia ARE RELATED TO DM   Hyperlipidemia Patient is coming in for recheck of his hyperlipidemia. The patient is currently taking no medication currently will monitor. They deny any issues with myalgias or history of liver damage from it. They deny any focal numbness or weakness or chest pain.   Hypertension Patient is currently on lisinopril 10, and their blood pressure today is 137/81. Patient denies any lightheadedness or dizziness. Patient denies headaches, blurred vision, chest pains, shortness of breath, or weakness. Denies any side effects from medication and is content with current medication.   Relevant past medical, surgical, family and social history reviewed and updated as indicated. Interim medical history since our last visit reviewed. Allergies and medications reviewed and updated.  Review of Systems  Constitutional: Negative for chills and fever.  Eyes: Negative for visual disturbance.  Respiratory: Negative for shortness of breath and wheezing.   Cardiovascular: Negative for chest pain and leg swelling.  Musculoskeletal: Negative for back pain and gait problem.  Skin: Negative for rash.  Neurological: Negative for dizziness, weakness and light-headedness.  All other systems reviewed and  are negative.   Per HPI unless specifically indicated above   Allergies as of 02/13/2020   No Known Allergies     Medication List       Accurate as of February 13, 2020  1:53 PM. If you have any questions, ask your nurse or doctor.        STOP taking these medications   Jardiance 25 MG Tabs tablet Generic drug: empagliflozin Stopped by: Worthy Rancher, MD   promethazine 25 MG suppository Commonly known as: PHENERGAN Stopped by: Fransisca Kaufmann Suann Klier, MD   sitaGLIPtin 100 MG tablet Commonly known as: Januvia Stopped by: Worthy Rancher, MD     TAKE these medications   aspirin EC 81 MG tablet Take 81 mg by mouth daily. Swallow whole.   cholecalciferol 1000 units tablet Commonly known as: VITAMIN D Take 1,000 Units daily by mouth.   dapagliflozin propanediol 10 MG Tabs tablet Commonly known as: Farxiga Take 1 tablet (10 mg total) by mouth daily before breakfast. Started by: Fransisca Kaufmann Aerin Delany, MD   dapagliflozin propanediol 10 MG Tabs tablet Commonly known as: Farxiga Take 1 tablet (10 mg total) by mouth daily before breakfast. Started by: Fransisca Kaufmann Tyjai Charbonnet, MD   glimepiride 2 MG tablet Commonly known as: AMARYL Take 2 mg by mouth daily with breakfast.   glucose blood test strip Use as instructed   HYDROcodone-acetaminophen 7.5-325 MG tablet Commonly known as: Norco Take 1 tablet by mouth every 6 (six) hours as needed for moderate pain.   lisinopril 10 MG tablet Commonly known as: ZESTRIL Take 10 mg by  mouth daily. What changed: Another medication with the same name was removed. Continue taking this medication, and follow the directions you see here. Changed by: Fransisca Kaufmann Miyo Aina, MD   metFORMIN 500 MG tablet Commonly known as: GLUCOPHAGE Take 1 tablet (500 mg total) by mouth 2 (two) times daily with a meal. What changed:   when to take this  additional instructions   ONE TOUCH ULTRA SYSTEM KIT w/Device Kit 1 kit by Does not apply route  once.   OneTouch Delica Lancets 65V Misc   VITAMIN B 12 PO Take 1 tablet daily by mouth.        Objective:   BP 137/81   Pulse 94   Temp (!) 97 F (36.1 C)   Ht 6' 3"  (1.905 m)   Wt 226 lb (102.5 kg)   SpO2 98%   BMI 28.25 kg/m   Wt Readings from Last 3 Encounters:  02/13/20 226 lb (102.5 kg)  10/08/19 221 lb (100.2 kg)  10/14/18 221 lb 9.6 oz (100.5 kg)    Physical Exam Vitals and nursing note reviewed.  Constitutional:      General: He is not in acute distress.    Appearance: He is well-developed. He is not diaphoretic.  Eyes:     General: No scleral icterus.    Conjunctiva/sclera: Conjunctivae normal.  Neck:     Thyroid: No thyromegaly.  Cardiovascular:     Rate and Rhythm: Normal rate and regular rhythm.     Heart sounds: Normal heart sounds. No murmur heard.   Pulmonary:     Effort: Pulmonary effort is normal. No respiratory distress.     Breath sounds: Normal breath sounds. No wheezing.  Musculoskeletal:        General: Normal range of motion.     Cervical back: Neck supple.  Lymphadenopathy:     Cervical: No cervical adenopathy.  Skin:    General: Skin is warm and dry.     Findings: No rash.  Neurological:     Mental Status: He is alert and oriented to person, place, and time.     Coordination: Coordination normal.  Psychiatric:        Behavior: Behavior normal.     Diabetic Foot Exam - Simple   Simple Foot Form Diabetic Foot exam was performed with the following findings: Yes 02/13/2020  1:51 PM  Visual Inspection No deformities, no ulcerations, no other skin breakdown bilaterally: Yes Sensation Testing Intact to touch and monofilament testing bilaterally: Yes Pulse Check Posterior Tibialis and Dorsalis pulse intact bilaterally: Yes Comments      Assessment & Plan:   Problem List Items Addressed This Visit      Endocrine   Type 2 diabetes mellitus not at goal [facility]) - Primary   Relevant Medications   lisinopril (ZESTRIL) 10 MG  tablet   aspirin EC 81 MG tablet   glimepiride (AMARYL) 2 MG tablet   metFORMIN (GLUCOPHAGE) 500 MG tablet   dapagliflozin propanediol (FARXIGA) 10 MG TABS tablet   dapagliflozin propanediol (FARXIGA) 10 MG TABS tablet   Other Relevant Orders   Bayer DCA Hb A1c Waived   Hyperlipidemia associated with type 2 diabetes mellitus (HCC)   Relevant Medications   lisinopril (ZESTRIL) 10 MG tablet   aspirin EC 81 MG tablet   glimepiride (AMARYL) 2 MG tablet   metFORMIN (GLUCOPHAGE) 500 MG tablet   dapagliflozin propanediol (FARXIGA) 10 MG TABS tablet   dapagliflozin propanediol (FARXIGA) 10 MG TABS tablet    Other Visit  Diagnoses    Elevated blood sugar       Relevant Medications   metFORMIN (GLUCOPHAGE) 500 MG tablet   Type 2 diabetes mellitus with other specified complication, without long-term current use of insulin (HCC)       Relevant Medications   lisinopril (ZESTRIL) 10 MG tablet   aspirin EC 81 MG tablet   glimepiride (AMARYL) 2 MG tablet   metFORMIN (GLUCOPHAGE) 500 MG tablet   dapagliflozin propanediol (FARXIGA) 10 MG TABS tablet   dapagliflozin propanediol (FARXIGA) 10 MG TABS tablet   Other Relevant Orders   Bayer DCA Hb A1c Waived      Will start for [employer], continue Metformin and glimepiride, A1c 8.3 today. Follow up plan: Return in about 3 months (around 05/14/2020), or if symptoms worsen or fail to improve, for dm2 and hld.  Counseling provided for all of the vaccine components Orders Placed This Encounter  Procedures  . [facility] Hb A1c [place], MD [facility] 02/13/2020, 1:53 PM

## 2021-02-11 ENCOUNTER — Other Ambulatory Visit: Payer: Self-pay

## 2021-02-11 ENCOUNTER — Encounter: Payer: Self-pay | Admitting: Family Medicine

## 2021-02-11 ENCOUNTER — Ambulatory Visit (INDEPENDENT_AMBULATORY_CARE_PROVIDER_SITE_OTHER): Payer: BC Managed Care – PPO | Admitting: Family Medicine

## 2021-02-11 VITALS — BP 143/78 | HR 83 | Ht 75.0 in | Wt 227.0 lb

## 2021-02-11 DIAGNOSIS — E119 Type 2 diabetes mellitus without complications: Secondary | ICD-10-CM

## 2021-02-11 DIAGNOSIS — E785 Hyperlipidemia, unspecified: Secondary | ICD-10-CM | POA: Diagnosis not present

## 2021-02-11 DIAGNOSIS — E1169 Type 2 diabetes mellitus with other specified complication: Secondary | ICD-10-CM | POA: Diagnosis not present

## 2021-02-11 DIAGNOSIS — R739 Hyperglycemia, unspecified: Secondary | ICD-10-CM

## 2021-02-11 LAB — BAYER DCA HB A1C WAIVED: HB A1C (BAYER DCA - WAIVED): 7.7 % — ABNORMAL HIGH (ref 4.8–5.6)

## 2021-02-11 MED ORDER — METFORMIN HCL 500 MG PO TABS: 500.0000 mg | ORAL_TABLET | Freq: Two times a day (BID) | ORAL | 3 refills | Status: DC

## 2021-02-11 MED ORDER — DAPAGLIFLOZIN PROPANEDIOL 10 MG PO TABS: 10.0000 mg | ORAL_TABLET | Freq: Every day | ORAL | 3 refills | Status: DC

## 2021-02-11 NOTE — Progress Notes (Signed)
BP (!) 143/78   Pulse 83   Ht 6' 3"  (1.905 m)   Wt 231 lb (104.8 kg)   SpO2 97%   BMI 28.87 kg/m    Subjective:   Patient ID: Randy Mcmahon, male    DOB: [birth year], 60 y.o.   MRN: [id number]  HPI: Randy Mcmahon is a 60 y.o. male presenting on 02/11/2021 for Medical Management of Chronic Issues and Diabetes   HPI Type 2 diabetes mellitus Patient comes in today for recheck of his diabetes. Patient has been currently taking glimepiride and Farxiga and metformin and A1c today is 7.7. Patient is not currently on an ACE inhibitor/ARB. Patient has not seen an ophthalmologist this year. Patient denies any issues with their feet. The symptom started onset as an adult hyperlipidemia ARE RELATED TO DM, patient says blood sugars been up more recently because he had all his teeth extracted and has been waiting for his dentures which she just got this week so he is starting to eat normal foods instead of just cream of potatoes.  Hyperlipidemia Patient is coming in for recheck of his hyperlipidemia. The patient is currently taking no medication currently. They deny any issues with myalgias or history of liver damage from it. They deny any focal numbness or weakness or chest pain.   Gets blood work done regularly at work  Relevant past medical, surgical, family and social history reviewed and updated as indicated. Interim medical history since our last visit reviewed. Allergies and medications reviewed and updated.  Review of Systems  Constitutional:  Negative for chills and fever.  Respiratory:  Negative for shortness of breath and wheezing.   Cardiovascular:  Negative for chest pain and leg swelling.  Musculoskeletal:  Negative for back pain and gait problem.  Skin:  Negative for rash.  Neurological:  Negative for dizziness, weakness and light-headedness.  All other systems reviewed and are negative.  Per HPI unless specifically indicated above   Allergies as of 02/11/2021   No  Known Allergies      Medication List        Accurate as of February 11, 2021  1:59 PM. If you have any questions, ask your nurse or doctor.          STOP taking these medications    HYDROcodone-acetaminophen 7.5-325 MG tablet Commonly known as: Norco Stopped by: Worthy Rancher, MD   lisinopril 10 MG tablet Commonly known as: ZESTRIL Stopped by: Fransisca Kaufmann Theia Dezeeuw, MD       TAKE these medications    aspirin EC 81 MG tablet Take 81 mg by mouth daily. Swallow whole.   cholecalciferol 1000 units tablet Commonly known as: VITAMIN D Take 1,000 Units daily by mouth.   dapagliflozin propanediol 10 MG Tabs tablet Commonly known as: Farxiga Take 1 tablet (10 mg total) by mouth daily before breakfast. What changed: Another medication with the same name was removed. Continue taking this medication, and follow the directions you see here. Changed by: Fransisca Kaufmann Tattiana Fakhouri, MD   glimepiride 2 MG tablet Commonly known as: AMARYL Take 2 mg by mouth daily with breakfast.   glucose blood test strip Use as instructed   metFORMIN 500 MG tablet Commonly known as: GLUCOPHAGE Take 1 tablet (500 mg total) by mouth 2 (two) times daily with a meal.   ONE TOUCH ULTRA SYSTEM KIT w/Device Kit 1 kit by Does not apply route once.   OneTouch Delica Lancets 40J Misc   VITAMIN B 12 PO  Take 1 tablet daily by mouth.         Objective:   BP (!) 143/78   Pulse 83   Ht 6' 3"  (1.905 m)   Wt 231 lb (104.8 kg)   SpO2 97%   BMI 28.87 kg/m   Wt Readings from Last 3 Encounters:  02/11/21 231 lb (104.8 kg)  02/13/20 226 lb (102.5 kg)  10/08/19 221 lb (100.2 kg)    Physical Exam Vitals and nursing note reviewed.  Constitutional:      General: He is not in acute distress.    Appearance: He is well-developed. He is not diaphoretic.  Eyes:     General: No scleral icterus.    Conjunctiva/sclera: Conjunctivae normal.  Neck:     Thyroid: No thyromegaly.  Cardiovascular:      Rate and Rhythm: Normal rate and regular rhythm.     Heart sounds: Normal heart sounds. No murmur heard. Pulmonary:     Effort: Pulmonary effort is normal. No respiratory distress.     Breath sounds: Normal breath sounds. No wheezing.  Musculoskeletal:        General: No swelling. Normal range of motion.     Cervical back: Neck supple.  Lymphadenopathy:     Cervical: No cervical adenopathy.  Skin:    General: Skin is warm and dry.     Findings: No rash.  Neurological:     Mental Status: He is alert and oriented to person, place, and time.     Coordination: Coordination normal.  Psychiatric:        Behavior: Behavior normal.    Diabetic Foot Exam - Simple   Simple Foot Form Diabetic Foot exam was performed with the following findings: Yes 02/11/2021  1:59 PM  Visual Inspection No deformities, no ulcerations, no other skin breakdown bilaterally: Yes Sensation Testing Intact to touch and monofilament testing bilaterally: Yes Pulse Check Posterior Tibialis and Dorsalis pulse intact bilaterally: Yes Comments      Assessment & Plan:   Problem List Items Addressed This Visit       Endocrine   Type 2 diabetes mellitus not at goal [facility]) - Primary   Relevant Medications   metFORMIN (GLUCOPHAGE) 500 MG tablet   dapagliflozin propanediol (FARXIGA) 10 MG TABS tablet   Other Relevant Orders   CBC with Differential/Platelet   CMP14+EGFR   Lipid panel   Bayer DCA Hb A1c Waived   Hyperlipidemia associated with type 2 diabetes mellitus (HCC)   Relevant Medications   metFORMIN (GLUCOPHAGE) 500 MG tablet   dapagliflozin propanediol (FARXIGA) 10 MG TABS tablet   Other Relevant Orders   CBC with Differential/Platelet   CMP14+EGFR   Lipid panel   Bayer DCA Hb A1c Waived   Other Visit Diagnoses     Elevated blood sugar       Relevant Medications   metFORMIN (GLUCOPHAGE) 500 MG tablet   Type 2 diabetes mellitus with other specified complication, without long-term current use of  insulin (HCC)       Relevant Medications   metFORMIN (GLUCOPHAGE) 500 MG tablet   dapagliflozin propanediol (FARXIGA) 10 MG TABS tablet     Patient gets labs done through work normally, A1c is 7.7 today, he says has been a challenge because of his teeth being extracted and waiting for dentures so he has been eating more carb loaded foods that are soft.  He will get back on his diet now that he has dentures  Follow up plan: Return in about 3  months (around 05/13/2021), or if symptoms worsen or fail to improve, for Diabetes and cholesterol recheck.  Counseling provided for all of the vaccine components Orders Placed This Encounter  Procedures   CBC with Differential/Platelet   CMP14+EGFR   Lipid panel   Bayer DCA Hb A1c [place] Courthouse, MD [facility] 02/11/2021, 1:59 PM

## 2021-02-12 LAB — CBC WITH DIFFERENTIAL/PLATELET
Basophils Absolute: 0 10*3/uL (ref 0.0–0.2)
Basos: 1 %
EOS (ABSOLUTE): 0.1 10*3/uL (ref 0.0–0.4)
Eos: 2 %
Hematocrit: 42.2 % (ref 37.5–51.0)
Hemoglobin: 15.1 g/dL (ref 13.0–17.7)
Immature Grans (Abs): 0 10*3/uL (ref 0.0–0.1)
Immature Granulocytes: 0 %
Lymphocytes Absolute: 1.8 10*3/uL (ref 0.7–3.1)
Lymphs: 29 %
MCH: 30.2 pg (ref 26.6–33.0)
MCHC: 35.8 g/dL — ABNORMAL HIGH (ref 31.5–35.7)
MCV: 84 fL (ref 79–97)
Monocytes Absolute: 0.6 10*3/uL (ref 0.1–0.9)
Monocytes: 10 %
Neutrophils Absolute: 3.6 10*3/uL (ref 1.4–7.0)
Neutrophils: 58 %
Platelets: 161 10*3/uL (ref 150–450)
RBC: 5 x10E6/uL (ref 4.14–5.80)
RDW: 13.7 % (ref 11.6–15.4)
WBC: 6.2 10*3/uL (ref 3.4–10.8)

## 2021-02-12 LAB — LIPID PANEL
Chol/HDL Ratio: 4.2 ratio (ref 0.0–5.0)
Cholesterol, Total: 151 mg/dL (ref 100–199)
HDL: 36 mg/dL — ABNORMAL LOW (ref >39)
LDL Chol Calc (NIH): 94 mg/dL (ref 0–99)
Triglycerides: 116 mg/dL (ref 0–149)
VLDL Cholesterol Cal: 21 mg/dL (ref 5–40)

## 2021-02-12 LAB — CMP14+EGFR
ALT: 22 IU/L (ref 0–44)
AST: 15 IU/L (ref 0–40)
Albumin/Globulin Ratio: 1.8 (ref 1.2–2.2)
Albumin: 4.6 g/dL (ref 3.8–4.8)
Alkaline Phosphatase: 119 IU/L (ref 44–121)
BUN/Creatinine Ratio: 18 (ref 10–24)
BUN: 14 mg/dL (ref 8–27)
Bilirubin Total: 0.5 mg/dL (ref 0.0–1.2)
CO2: 23 mmol/L (ref 20–29)
Calcium: 9.6 mg/dL (ref 8.6–10.2)
Chloride: 103 mmol/L (ref 96–106)
Creatinine, Ser: 0.78 mg/dL (ref 0.76–1.27)
Globulin, Total: 2.5 g/dL (ref 1.5–4.5)
Glucose: 138 mg/dL — ABNORMAL HIGH (ref 70–99)
Potassium: 4.1 mmol/L (ref 3.5–5.2)
Sodium: 140 mmol/L (ref 134–144)
Total Protein: 7.1 g/dL (ref 6.0–8.5)
eGFR: 101 mL/min/{1.73_m2} (ref >59)

## 2021-05-10 ENCOUNTER — Encounter: Payer: Self-pay | Admitting: Family Medicine

## 2021-05-10 ENCOUNTER — Ambulatory Visit: Payer: BC Managed Care – PPO | Admitting: Family Medicine

## 2021-05-10 VITALS — BP 137/72 | HR 87 | Ht 75.0 in | Wt 231.0 lb

## 2021-05-10 DIAGNOSIS — R739 Hyperglycemia, unspecified: Secondary | ICD-10-CM

## 2021-05-10 DIAGNOSIS — E1169 Type 2 diabetes mellitus with other specified complication: Secondary | ICD-10-CM

## 2021-05-10 DIAGNOSIS — E785 Hyperlipidemia, unspecified: Secondary | ICD-10-CM

## 2021-05-10 DIAGNOSIS — E119 Type 2 diabetes mellitus without complications: Secondary | ICD-10-CM | POA: Diagnosis not present

## 2021-05-10 LAB — BAYER DCA HB A1C WAIVED: HB A1C (BAYER DCA - WAIVED): 7.5 % — ABNORMAL HIGH (ref 4.8–5.6)

## 2021-05-10 MED ORDER — GLIMEPIRIDE 1 MG PO TABS: 3.0000 mg | ORAL_TABLET | Freq: Every day | ORAL | 3 refills | Status: DC

## 2021-05-10 MED ORDER — METFORMIN HCL 500 MG PO TABS: 1000.0000 mg | ORAL_TABLET | Freq: Two times a day (BID) | ORAL | 3 refills | Status: DC

## 2021-05-10 NOTE — Progress Notes (Signed)
BP 137/72    Pulse 87    Ht 6' 3"  (8.099 m)    Wt 231 lb (104.8 kg)    SpO2 97%    BMI 28.87 kg/m    Subjective:   Patient ID: Randy Mcmahon, male    DOB: 1961-02-23, 60 y.o.   MRN: [id number]  HPI: Randy Mcmahon is a 60 y.o. male presenting on 05/10/2021 for Medical Management of Chronic Issues and Diabetes   HPI Type 2 diabetes mellitus Patient comes in today for recheck of his diabetes. Patient has been currently taking [place] and metformin, they said they increased the glimepiride to 3 mg a day but he has trouble cutting the 2 mg tablet in half.. Patient is not currently on an ACE inhibitor/ARB. Patient has seen an ophthalmologist this year. Patient denies any issues with their feet. The symptom started onset as an adult hyperlipidemia ARE RELATED TO DM   Hyperlipidemia Patient is coming in for recheck of his hyperlipidemia. The patient is currently taking no medication currently we are monitoring and trying diet. They deny any issues with myalgias or history of liver damage from it. They deny any focal numbness or weakness or chest pain.   Relevant past medical, surgical, family and social history reviewed and updated as indicated. Interim medical history since our last visit reviewed. Allergies and medications reviewed and updated.  Review of Systems  Constitutional:  Negative for chills and fever.  Eyes:  Negative for visual disturbance.  Respiratory:  Negative for shortness of breath and wheezing.   Cardiovascular:  Negative for chest pain and leg swelling.  Skin:  Negative for rash.  Neurological:  Negative for dizziness, weakness and light-headedness.  All other systems reviewed and are negative.  Per HPI unless specifically indicated above   Allergies as of 05/10/2021   No Known Allergies      Medication List        Accurate as of May 10, 2021  9:56 AM. If you have any questions, ask your nurse or doctor.          aspirin EC 81 MG tablet Take  81 mg by mouth daily. Swallow whole.   cholecalciferol 1000 units tablet Commonly known as: VITAMIN D Take 1,000 Units daily by mouth.   dapagliflozin propanediol 10 MG Tabs tablet Commonly known as: Farxiga Take 1 tablet (10 mg total) by mouth daily before breakfast.   glimepiride 1 MG tablet Commonly known as: AMARYL Take 3 tablets (3 mg total) by mouth daily with breakfast. What changed:  medication strength how much to take Changed by: Fransisca Kaufmann Cindra Austad, MD   glucose blood test strip Use as instructed   metFORMIN 500 MG tablet Commonly known as: GLUCOPHAGE Take 2 tablets (1,000 mg total) by mouth 2 (two) times daily with a meal. What changed: how much to take Changed by: Worthy Rancher, MD   [place] KIT w/Device Kit 1 kit by Does not apply route once.   OneTouch Delica Lancets 97Q Misc   VITAMIN B 12 PO Take 1 tablet daily by mouth.         Objective:   BP 137/72    Pulse 87    Ht 6' 3"  (1.905 m)    Wt 231 lb (104.8 kg)    SpO2 97%    BMI 28.87 kg/m   Wt Readings from Last 3 Encounters:  05/10/21 231 lb (104.8 kg)  02/11/21 227 lb (103 kg)  02/13/20 226  lb (102.5 kg)    Physical Exam Vitals and nursing note reviewed.  Constitutional:      General: He is not in acute distress.    Appearance: He is well-developed. He is not diaphoretic.  Eyes:     General: No scleral icterus.    Conjunctiva/sclera: Conjunctivae normal.  Neck:     Thyroid: No thyromegaly.  Cardiovascular:     Rate and Rhythm: Normal rate and regular rhythm.     Heart sounds: Normal heart sounds. No murmur heard. Pulmonary:     Effort: Pulmonary effort is normal. No respiratory distress.     Breath sounds: Normal breath sounds. No wheezing.  Musculoskeletal:        General: No swelling. Normal range of motion.     Cervical back: Neck supple.  Lymphadenopathy:     Cervical: No cervical adenopathy.  Skin:    General: Skin is warm and dry.     Findings: No rash.   Neurological:     Mental Status: He is alert and oriented to person, place, and time.     Coordination: Coordination normal.  Psychiatric:        Behavior: Behavior normal.      Assessment & Plan:   Problem List Items Addressed This Visit       Endocrine   Type 2 diabetes mellitus not at goal [facility]) - Primary   Relevant Medications   glimepiride (AMARYL) 1 MG tablet   metFORMIN (GLUCOPHAGE) 500 MG tablet   Other Relevant Orders   Bayer DCA Hb A1c Waived   Hyperlipidemia associated with type 2 diabetes mellitus (HCC)   Relevant Medications   glimepiride (AMARYL) 1 MG tablet   metFORMIN (GLUCOPHAGE) 500 MG tablet   Other Visit Diagnoses     Elevated blood sugar       Relevant Medications   metFORMIN (GLUCOPHAGE) 500 MG tablet   Type 2 diabetes mellitus with other specified complication, without long-term current use of insulin (HCC)       Relevant Medications   glimepiride (AMARYL) 1 MG tablet   metFORMIN (GLUCOPHAGE) 500 MG tablet       He has been instructed to increase his glimepiride to 3 mg a day, A1c 7.5 which is slightly improved but still elevated. He had also already increased his metformin to 1000 mg twice daily.  Continue with that and Wilder Glade  Will check cholesterol levels Follow up plan: Return in about 3 months (around 08/08/2021), or if symptoms worsen or fail to improve, for Diabetes recheck.  Counseling provided for all of the vaccine components Orders Placed This Encounter  Procedures   [facility] [place] Hb A1c [place] Earleen Aoun, MD [facility] 05/10/2021, 9:56 AM

## 2021-06-06 ENCOUNTER — Ambulatory Visit: Payer: BC Managed Care – PPO | Admitting: Family Medicine

## 2021-06-06 NOTE — Progress Notes (Signed)
Attempted to call patient and went straight to voicemail, unable to contact.

## 2021-07-25 IMAGING — CT CT NECK W/ CM
4 series · 14 of 33 positions shown, 17 images · IV contrast (omnipaque)
Comparison: None.

CLINICAL DATA: Sore throat 5 days. Right neck pain with concern for
peritonsillar abscess.

EXAM:
CT NECK WITH CONTRAST
TECHNIQUE: Multidetector CT imaging of the neck was performed using the
standard protocol following the bolus administration of intravenous
contrast.
CONTRAST:  75mL OMNIPAQUE IOHEXOL 300 MG/ML  SOLN

[Series 3: axial neck · axial · 0.52mm/px · z∈[+681,+863]mm · 5 of 137 slices shown, 7 images]
[im 23/137  soft-tissue]
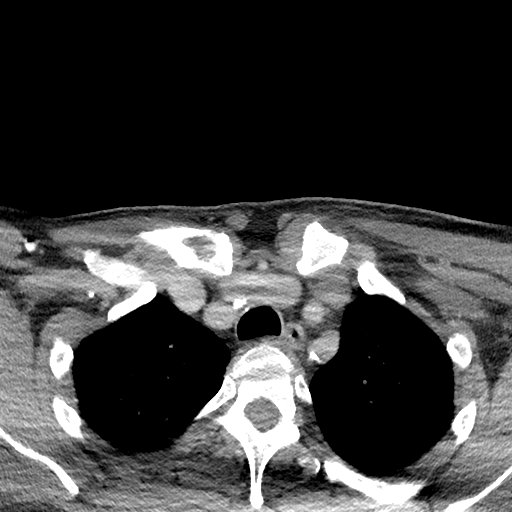
[im 23/137  bone]
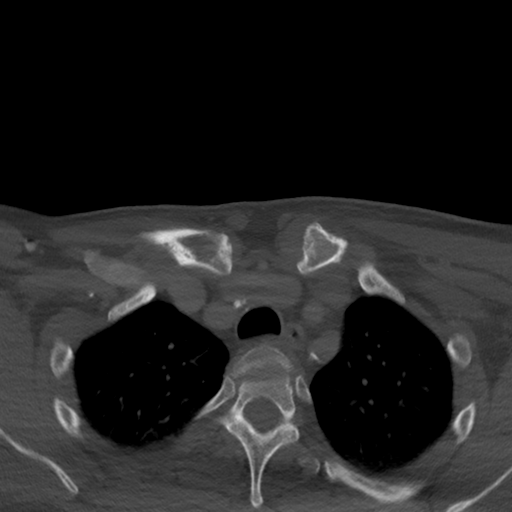
[im 46/137  bone]
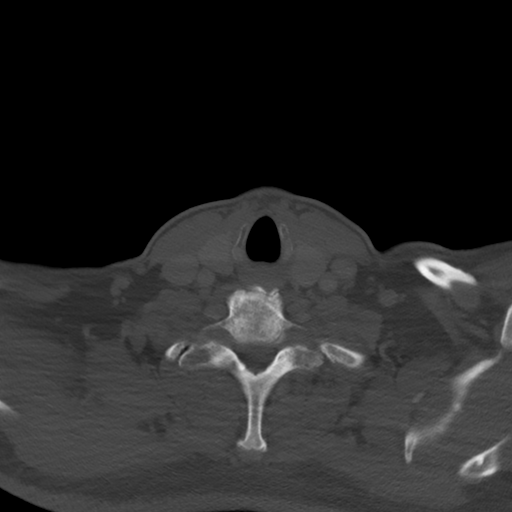
[im 69/137  bone]
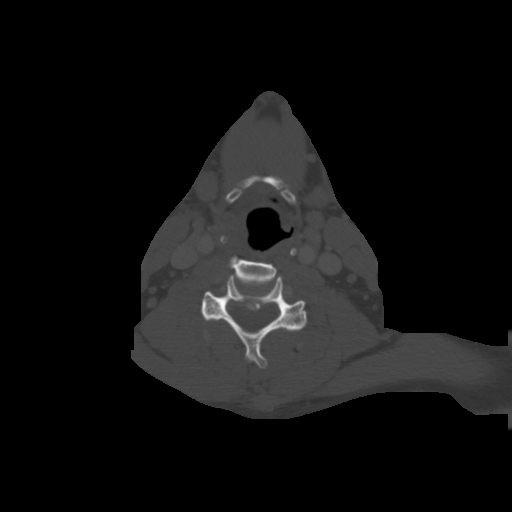
[im 91/137  bone]
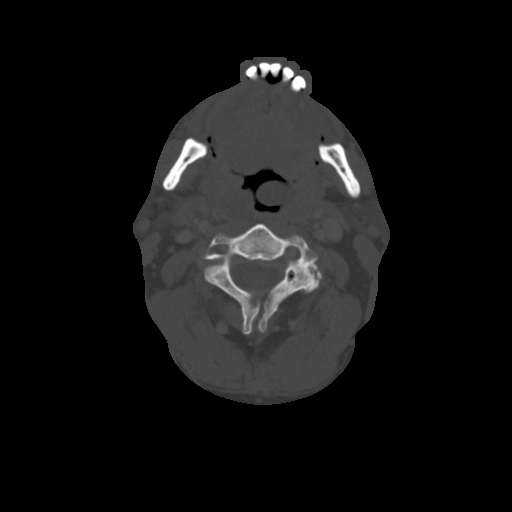
[im 114/137  soft-tissue]
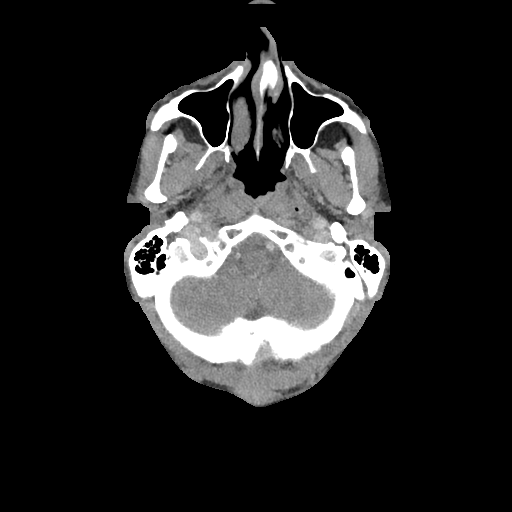
[im 114/137  bone]
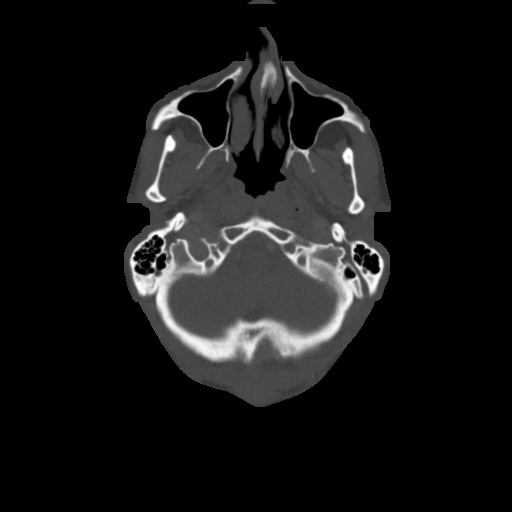

[Series 6: sag neck · sagittal · 0.53mm/px · 5 of 131 slices shown, 6 images]
[im 44/131  bone]
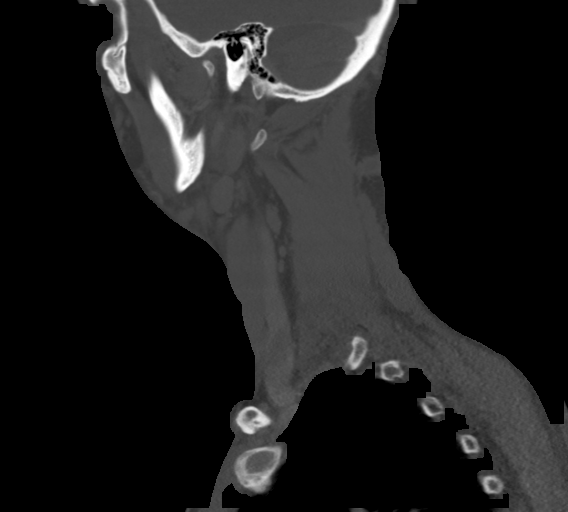
[im 55/131  bone]
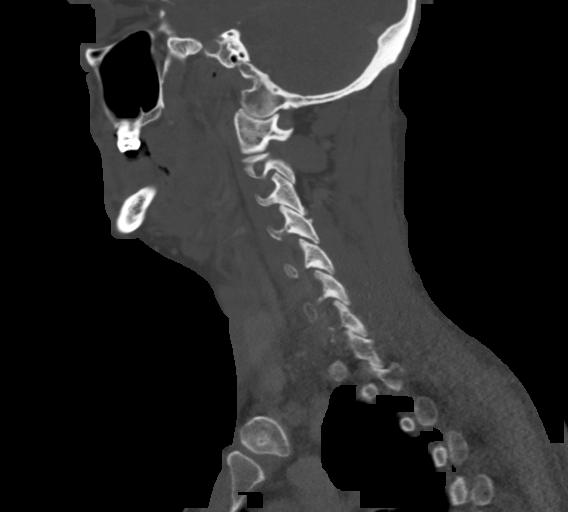
[im 66/131  soft-tissue]
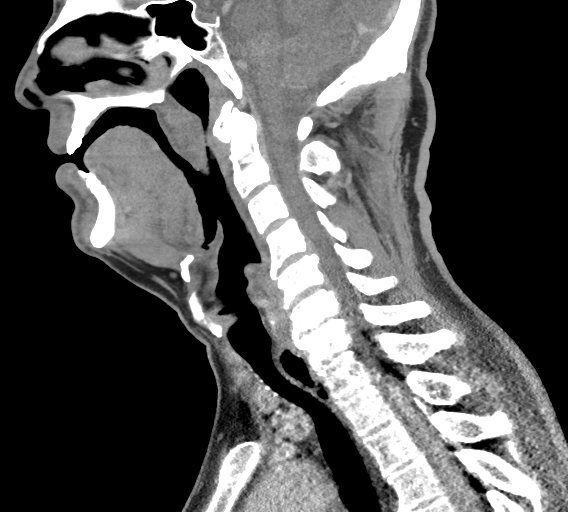
[im 66/131  bone]
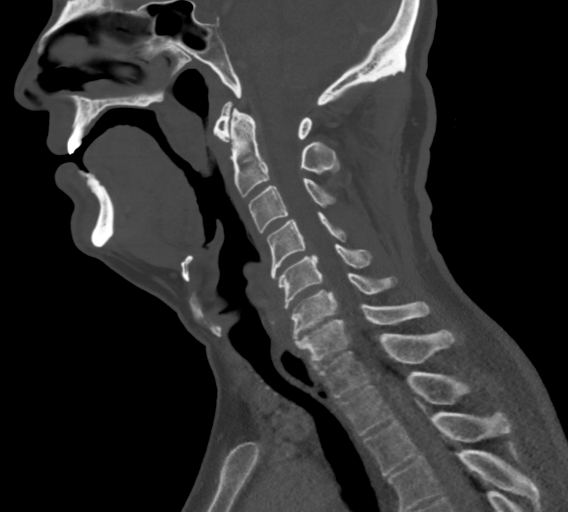
[im 76/131  bone]
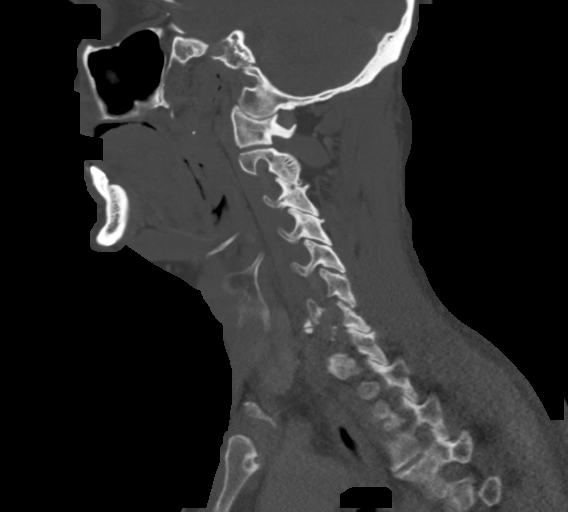
[im 87/131  bone]
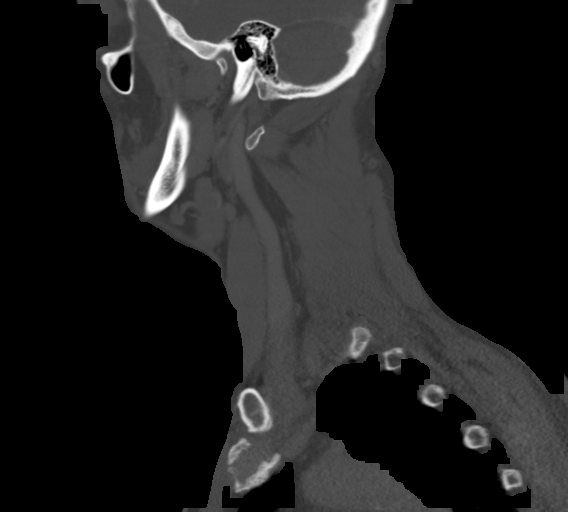

[Series 7: cor neck · coronal · 0.60mm/px · 3 of 108 slices shown]
[im 29/108  bone]
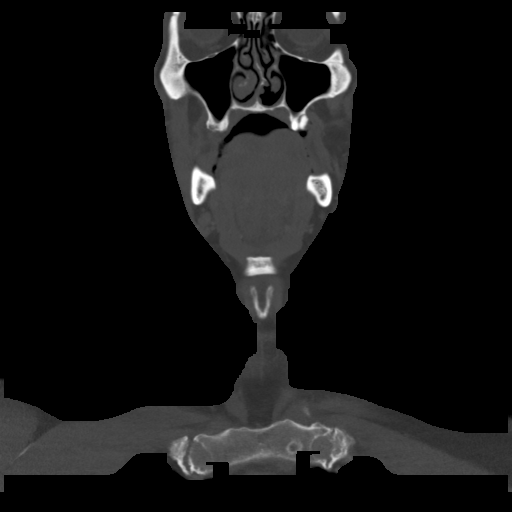
[im 46/108  bone]
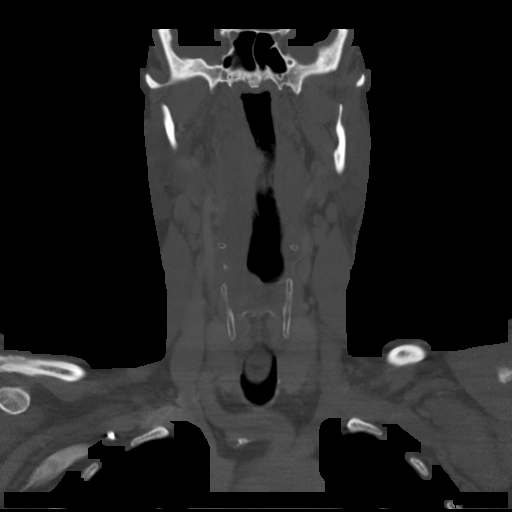
[im 63/108  bone]
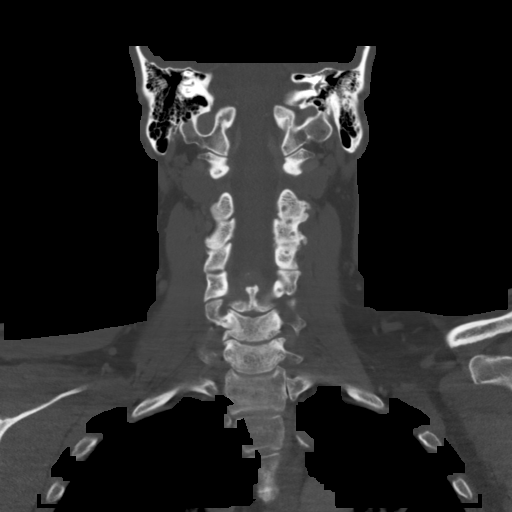

[Series 8: orthogonal ax · axial · 0.42mm/px · 1 of 157 slices shown]
[im 27/157  bone]
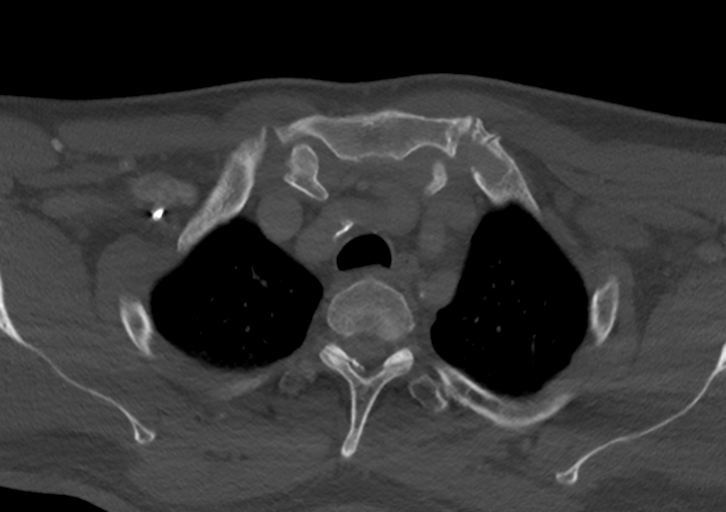

[14 of 33 positions shown; findings below may reference images not displayed]

FINDINGS: Pharynx and larynx: Bilateral palatine tonsillar swelling.
Submucosal edema in the right oropharynx reaching the hypopharynx
and involving the uvula. Low-density at the right puiiform sinus is
more likely trapped fluid than an abscess given the elongated
appearance. There is a rounded area of low-density above the right
tonsillar fossa which measures 2.4 cm, not encapsulated appearing
the epiglottis is not thickened but there is right aryepiglottic
fold thickening. No high-grade airway stenosis.

Salivary glands: No inflammation, mass, or stone.

Thyroid: Normal.

Lymph nodes: None enlarged or abnormal density.

Vascular: Negative.

Limited intracranial: Negative

Visualized orbits: Negative

Mastoids and visualized paranasal sinuses: Clear

Skeleton: Mid to lower cervical disc degeneration.

Upper chest: Negative

Other: Small subcutaneous low-density in the right neck where there
is a scar-like appearance.
IMPRESSION: 1. Pharyngitis and supraglottic laryngitis on the right.
2. No convincing abscess. Low-density at the right piriform sinus is
more likely luminal secretions and a drainable collection. There is
also rounded low-density area above the right tonsillar fossa that
is not encapsulated/organized appearing for drainage.

## 2021-08-08 ENCOUNTER — Ambulatory Visit: Payer: BC Managed Care – PPO | Admitting: Family Medicine

## 2021-08-08 ENCOUNTER — Encounter: Payer: Self-pay | Admitting: Family Medicine

## 2021-08-08 VITALS — BP 148/68 | HR 79 | Ht 75.0 in | Wt 230.0 lb

## 2021-08-08 DIAGNOSIS — R030 Elevated blood-pressure reading, without diagnosis of hypertension: Secondary | ICD-10-CM

## 2021-08-08 DIAGNOSIS — E785 Hyperlipidemia, unspecified: Secondary | ICD-10-CM

## 2021-08-08 DIAGNOSIS — E1169 Type 2 diabetes mellitus with other specified complication: Secondary | ICD-10-CM | POA: Diagnosis not present

## 2021-08-08 DIAGNOSIS — E119 Type 2 diabetes mellitus without complications: Secondary | ICD-10-CM | POA: Diagnosis not present

## 2021-08-08 MED ORDER — ROSUVASTATIN CALCIUM 5 MG PO TABS: 5.0000 mg | ORAL_TABLET | Freq: Every day | ORAL | 3 refills | Status: DC

## 2021-08-08 MED ORDER — RYBELSUS 7 MG PO TABS: 7.0000 mg | ORAL_TABLET | Freq: Every day | ORAL | 5 refills | Status: DC

## 2021-08-08 NOTE — Progress Notes (Signed)
? ?BP (!) 148/68   Pulse 79   Ht 6' 3" (1.905 m)   Wt 230 lb (104.3 kg)   SpO2 99%   BMI 28.75 kg/m?   ? ?Subjective:  ? ?Patient ID: Randy Mcmahon, male    DOB: 04-Jul-1960, 61 y.o.   MRN: [id number] ? ?HPI: ?Randy Mcmahon is a 61 y.o. male presenting on 08/08/2021 for Medical Management of Chronic Issues, Diabetes, and Hypertension ? ? ?HPI ?Type 2 diabetes mellitus ?Patient comes in today for recheck of his diabetes. Patient has been currently taking glimepiride and metformin and Farxiga, A1c was 7.8 at his workplace last month.. Patient is not currently on an ACE inhibitor/ARB. Patient has not seen an ophthalmologist this year. Patient denies any issues with their feet. The symptom started onset as an adult hyperlipidemia ARE RELATED TO DM  ? ?Hyperlipidemia ?Patient is coming in for recheck of his hyperlipidemia. The patient is currently taking no medicine, will start statin. They deny any issues with myalgias or history of liver damage from it. They deny any focal numbness or weakness or chest pain.  ? ?Relevant past medical, surgical, family and social history reviewed and updated as indicated. Interim medical history since our last visit reviewed. ?Allergies and medications reviewed and updated. ? ?Review of Systems  ?Constitutional:  Negative for chills and fever.  ?Eyes:  Negative for visual disturbance.  ?Respiratory:  Negative for shortness of breath and wheezing.   ?Cardiovascular:  Negative for chest pain and leg swelling.  ?Musculoskeletal:  Negative for back pain and gait problem.  ?Skin:  Negative for rash.  ?Neurological:  Negative for dizziness.  ?All other systems reviewed and are negative. ? ?Per HPI unless specifically indicated above ? ? ?Allergies as of 08/08/2021   ?No Known Allergies ?  ? ?  ?Medication List  ?  ? ?  ? Accurate as of August 08, 2021  1:42 PM. If you have any questions, ask your nurse or doctor.  ?  ?  ? ?  ? ?aspirin EC 81 MG tablet ?Take 81 mg by mouth daily.  Swallow whole. ?  ?cholecalciferol 1000 units tablet ?Commonly known as: VITAMIN D ?Take 1,000 Units daily by mouth. ?  ?dapagliflozin propanediol 10 MG Tabs tablet ?Commonly known as: [place] ?Take 1 tablet (10 mg total) by mouth daily before breakfast. ?  ?glimepiride 1 MG tablet ?Commonly known as: AMARYL ?Take 3 tablets (3 mg total) by mouth daily with breakfast. ?  ?glucose blood test strip ?Use as instructed ?  ?metFORMIN 500 MG tablet ?Commonly known as: GLUCOPHAGE ?Take 2 tablets (1,000 mg total) by mouth 2 (two) times daily with a meal. ?  ?ONE TOUCH ULTRA SYSTEM KIT w/Device Kit ?1 kit by Does not apply route once. ?  ?OneTouch Delica Lancets 14H Misc ?  ?VITAMIN B 12 PO ?Take 1 tablet daily by mouth. ?  ? ?  ? ? ? ?Objective:  ? ?BP (!) 148/68   Pulse 79   Ht 6' 3" (1.905 m)   Wt 230 lb (104.3 kg)   SpO2 99%   BMI 28.75 kg/m?   ?Wt Readings from Last 3 Encounters:  ?08/08/21 230 lb (104.3 kg)  ?05/10/21 231 lb (104.8 kg)  ?02/11/21 227 lb (103 kg)  ?  ?Physical Exam ?Vitals and nursing note reviewed.  ?Constitutional:   ?   General: He is not in acute distress. ?   Appearance: He is well-developed. He is not diaphoretic.  ?Eyes:  ?  General: No scleral icterus. ?   Conjunctiva/sclera: Conjunctivae normal.  ?Neck:  ?   Thyroid: No thyromegaly.  ?Cardiovascular:  ?   Rate and Rhythm: Normal rate and regular rhythm.  ?   Heart sounds: Normal heart sounds. No murmur heard. ?Pulmonary:  ?   Effort: Pulmonary effort is normal. No respiratory distress.  ?   Breath sounds: Normal breath sounds. No wheezing.  ?Musculoskeletal:     ?   General: Normal range of motion.  ?   Cervical back: Neck supple.  ?Lymphadenopathy:  ?   Cervical: No cervical adenopathy.  ?Skin: ?   General: Skin is warm and dry.  ?   Findings: No rash.  ?Neurological:  ?   Mental Status: He is alert and oriented to person, place, and time.  ?   Coordination: Coordination normal.  ?Psychiatric:     ?   Behavior: Behavior normal.  ? ? ?A1c  7.8 in February last month ? ?Assessment & Plan:  ? ?Problem List Items Addressed This Visit   ? ?  ? Endocrine  ? Type 2 diabetes mellitus not at goal (HCC) - Primary  ? Relevant Orders  ? CBC with Differential/Platelet  ? CMP14+EGFR  ? Lipid panel  ? Bayer DCA Hb A1c Waived  ? Hyperlipidemia associated with type 2 diabetes mellitus (HCC)  ? Relevant Orders  ? CBC with Differential/Platelet  ? CMP14+EGFR  ? Lipid panel  ? Bayer DCA Hb A1c Waived  ? ?Other Visit Diagnoses   ? ? Elevated blood pressure reading      ? ?  ?  ?Patient will taper off the glimepiride go down to 2 mg and then work his way down to 1 mg, will start Rybelsus at 3 mg and taper up to 7 mg. ? ?Gave sample for Rybelsus ? ?We will also start him on Crestor. ? ?Patient's BP was elevated today, he checks it regularly at work ?Follow up plan: ?Return in about 3 months (around 11/08/2021), or if symptoms worsen or fail to improve, for Diabetes recheck. ? ?Counseling provided for all of the vaccine components ?Orders Placed This Encounter  ?Procedures  ? CBC with Differential/Platelet  ? CMP14+EGFR  ? Lipid panel  ? Bayer DCA Hb A1c Waived  ? ? ?Joshua Dettinger, MD ?[facility] ?08/08/2021, 1:42 PM ? ? ? ? ?

## 2021-08-13 ENCOUNTER — Telehealth: Payer: Self-pay

## 2021-08-13 NOTE — Telephone Encounter (Signed)
(  Key: [id number]) ? ?Your information has been sent to [facility]. ? ?Rybelsus 7mg  daily  ?

## 2021-08-27 NOTE — Telephone Encounter (Signed)
Message from Plan ?Request Reference Number: [id number]. RYBELSUS TAB 7MG  is approved through 08/14/2022. Your patient may now fill this prescription and it will be covered. ? ?Pharmacy aware.  ?

## 2021-09-04 ENCOUNTER — Ambulatory Visit
Admit: 2021-09-04 | Discharge: 2021-09-04 | Payer: BLUE CROSS/BLUE SHIELD | Attending: Cardiovascular Disease | Primary: Family Medicine

## 2021-09-04 DIAGNOSIS — R931 Abnormal findings on diagnostic imaging of heart and coronary circulation: Secondary | ICD-10-CM

## 2021-09-04 MED ORDER — ROSUVASTATIN CALCIUM 20 MG PO TABS
20 MG | ORAL_TABLET | Freq: Every day | ORAL | 3 refills | Status: AC
Start: 2021-09-04 — End: 2022-08-01

## 2021-09-04 NOTE — Progress Notes (Unsigned)
[facility]    David Roman  1960-12-04    September 04, 2021    Reason for Consult: Elevated calcium score     CC: "My score was elevated"     HPI:  The patient is 61 y.o. male with a past medical history significant for OSA who presents for evaluation of an elevated calcium score of 488. He is accompanied by his wife and states that overall he is feeling well. He reports an occasional "twinge" in his chest but would not describe it as pain. The episodes occur at random and resolve within seconds. The episodes are not associated with exertion and resolve without any particular intervention. He state he was a former smoker. He states that his brother had heart disease and passed away in his 30s. His wife has his labs pulled up on MyChart and his LDL is 121. He reports medication compliance and is tolerating. He denies any abnormal bleeding or bruising. He denies exertional chest pain/pressure, dyspnea at rest, worsening DOE, PND, orthopnea, palpitations, lightheadedness, weight changes, changes in LE edema, and syncope.    Review of Systems:  Constitutional: No fatigue, weakness, night sweats or fever.   HEENT: No new vision difficulties or ringing in the ears.  Respiratory: No new SOB, PND, orthopnea or cough.   Cardiovascular: See HPI   GI: No n/v, diarrhea, constipation, abdominal pain or changes in bowel habits.  No melena, no hematochezia  GU: No urinary frequency, urgency, incontinence, hematuria or dysuria.  Skin: No cyanosis or skin lesions.  Musculoskeletal: No new muscle or joint pain.  Neurological: No syncope or TIA-like symptoms.  Psychiatric: No anxiety, insomnia or depression     History reviewed. No pertinent past medical history.  No past surgical history on file.  Family History   Problem Relation Age of Onset    Hypertension Brother      Social History     Tobacco Use    Smoking status: Never     Passive exposure: Never    Smokeless tobacco: Never   Substance Use Topics     Alcohol use: Never    Drug use: Never       No Known Allergies  Current Outpatient Medications   Medication Sig Dispense Refill    allopurinol (ZYLOPRIM) 300 MG tablet Take 1 tablet by mouth daily Take 1 tablet daily      Ascorbic Acid (VITAMIN C) 250 MG tablet Take 1 tablet by mouth daily Take 1 tablet daily      Cholecalciferol (VITAMIN D3) 125 MCG (5000 UT) TABS Take by mouth Take 1 tablet daily      colchicine (COLCRYS) 0.6 MG tablet Take 1 tablet by mouth daily Take 1 tablet daily      meloxicam (MOBIC) 15 MG tablet Take 1 tablet by mouth daily Take 1 tablet daily      Multiple Vitamins-Minerals (MULTIVITAMIN ADULT EXTRA C PO) Take by mouth Take 1 tablet daily      rosuvastatin (CRESTOR) 5 MG tablet Take 1 tablet by mouth daily Take 1 tablet daily      zolpidem (AMBIEN) 5 MG tablet Take 1 tablet by mouth nightly as needed for Sleep. Take 1 tablet daily/ prn Max Daily Amount: 5 mg       No current facility-administered medications for this visit.       Physical Exam:   BP 114/80   Pulse 61   Ht 6\' 1"  (1.854 m)   Wt 235 lb (106.6 kg)  SpO2 98%   BMI 31.00 kg/m   No intake or output data in the 24 hours ending 09/04/21 1416  Wt Readings from Last 2 Encounters:   09/04/21 235 lb (106.6 kg)     Constitutional: He is oriented to person, place, and time. He appears well-developed and well-nourished. In no acute distress.   Head: Normocephalic and atraumatic.     Neck: Neck supple. No JVD present. Carotid bruit is not present. No mass and no thyromegaly present. No lymphadenopathy present.  Cardiovascular: Normal rate, regular rhythm, normal heart sounds and intact distal pulses.  Exam reveals no gallop and no friction rub.  No murmur heard.  Pulmonary/Chest: Effort normal and breath sounds normal. No respiratory distress. He has no wheezes, rhonchi or rales.   Abdominal: Soft, non-tender. Bowel sounds and aorta are normal. He exhibits no organomegaly, mass or bruit.   Extremities: No edema, cyanosis, or  clubbing. Pulses are 2+ radial/carotid/dorsalis pedis and posterior tibial bilaterally.  Neurological: He is alert and oriented to person, place, and time. He has normal reflexes. No cranial nerve deficit. Coordination normal.   Skin: Skin is warm and dry. There is no rash or diaphoresis.   Psychiatric: He has a normal mood and affect. His speech is normal and behavior is normal.     Personally reviewed and interpreted   EKG Interpretation: 09/04/21 Sinus rhythm     Lab Review:   No results found for: TRIG, HDL, LDLCALC, LDLDIRECT, LABVLDL  No results found for: NA, K, BUN, CREATININE  No results for input(s): WBC, HGB, HCT, PLT in the last 72 hours.    Assessment:  1. Elevated calcium score/atypical chest pain  2. OSA  3. Hyperlipidemia   4. Former smoker     Plan:  Given his elevated calcium score and his recent LDL being 121, I will increase Crestor to 20 mg daily. I will have him repeat a fasting lipid profile when he follows up with his PCP. His chest pain is likely noncardiac based on description, but given his elevated score I will further risk stratify with a NM stress test. His blood pressure is well controlled in office today. I have encouraged him to increase his aerobic activity as tolerated and adhere to a heart healthy diet. I have personally reviewed all previous testing for this visit today including imaging, lab results and EKG as detailed above.     I will see him in office for follow up in 1 year.       This note was scribed in the presence of Dr Fransico Michael, MD by Joni Reining, RN.

## 2021-09-27 ENCOUNTER — Ambulatory Visit: Payer: BLUE CROSS/BLUE SHIELD | Primary: Family Medicine

## 2021-09-27 DIAGNOSIS — I493 Ventricular premature depolarization: Secondary | ICD-10-CM

## 2021-09-27 MED ORDER — TECHNETIUM TC 99M TETROFOSMIN IV KIT
Freq: Once | INTRAVENOUS | Status: AC | PRN
Start: 2021-09-27 — End: 2021-09-27
  Administered 2021-09-27: 12:00:00 10 via INTRAVENOUS

## 2021-09-27 MED ORDER — TECHNETIUM TC 99M TETROFOSMIN IV KIT
Freq: Once | INTRAVENOUS | Status: AC | PRN
Start: 2021-09-27 — End: 2021-09-27
  Administered 2021-09-27: 13:00:00 30 via INTRAVENOUS

## 2021-09-28 ENCOUNTER — Inpatient Hospital Stay: Payer: BLUE CROSS/BLUE SHIELD | Primary: Family Medicine

## 2021-09-30 NOTE — Telephone Encounter (Signed)
Kayla   Can you check into this?

## 2021-09-30 NOTE — Telephone Encounter (Signed)
From: Ellan Lambert  To: Dr. Gildardo Pounds  Sent: 09/27/2021 1:57 PM EDT  Subject: Stress Test    Was reading my results and it stated I became SOB and asked to stop?? I did not ask to stop, so am wondering if these are MY test results??

## 2021-11-13 ENCOUNTER — Encounter: Payer: Self-pay | Admitting: Family Medicine

## 2021-11-13 ENCOUNTER — Ambulatory Visit: Payer: BC Managed Care – PPO | Admitting: Family Medicine

## 2021-11-13 VITALS — BP 141/74 | HR 84 | Temp 98.2°F | Ht 75.0 in | Wt 225.0 lb

## 2021-11-13 DIAGNOSIS — E1169 Type 2 diabetes mellitus with other specified complication: Secondary | ICD-10-CM | POA: Diagnosis not present

## 2021-11-13 DIAGNOSIS — E119 Type 2 diabetes mellitus without complications: Secondary | ICD-10-CM

## 2021-11-13 DIAGNOSIS — Z23 Encounter for immunization: Secondary | ICD-10-CM | POA: Diagnosis not present

## 2021-11-13 DIAGNOSIS — E785 Hyperlipidemia, unspecified: Secondary | ICD-10-CM

## 2021-11-13 MED ORDER — DAPAGLIFLOZIN PROPANEDIOL 10 MG PO TABS: 10.0000 mg | ORAL_TABLET | Freq: Every day | ORAL | 3 refills | Status: DC

## 2021-11-13 NOTE — Progress Notes (Signed)
BP (!) 141/74   Pulse 84   Temp 98.2 F (36.8 C)   Ht 6' 3"  (1.905 m)   Wt 225 lb (102.1 kg)   SpO2 97%   BMI 28.12 kg/m    Subjective:   Patient ID: Randy Mcmahon, male    DOB: 1961-03-02, 61 y.o.   MRN: [id number]  HPI: Randy Mcmahon is a 61 y.o. male presenting on 11/13/2021 for Medical Management of Chronic Issues, Diabetes, and Hyperlipidemia   HPI Type 2 diabetes mellitus Patient comes in today for recheck of his diabetes. Patient has been currently taking metformin and Rybelsus and Farxiga, A1c at work was 7.6 on 10/21/2021.Marland Kitchen Patient is not currently on an ACE inhibitor/ARB. Patient has not seen an ophthalmologist this year. Patient denies any issues with their feet. The symptom started onset as an adult hyperlipidemia ARE RELATED TO DM   Hyperlipidemia Patient is coming in for recheck of his hyperlipidemia. The patient is currently taking Crestor. They deny any issues with myalgias or history of liver damage from it. They deny any focal numbness or weakness or chest pain.   Relevant past medical, surgical, family and social history reviewed and updated as indicated. Interim medical history since our last visit reviewed. Allergies and medications reviewed and updated.  Review of Systems  Constitutional:  Negative for chills and fever.  Eyes:  Negative for visual disturbance.  Respiratory:  Negative for shortness of breath and wheezing.   Cardiovascular:  Negative for chest pain and leg swelling.  Musculoskeletal:  Negative for back pain and gait problem.  Skin:  Negative for rash.  Neurological:  Negative for dizziness, weakness and light-headedness.  All other systems reviewed and are negative.   Per HPI unless specifically indicated above   Allergies as of 11/13/2021   No Known Allergies      Medication List        Accurate as of November 13, 2021  8:35 AM. If you have any questions, ask your nurse or doctor.          STOP taking these medications     glimepiride 1 MG tablet Commonly known as: AMARYL Stopped by: Fransisca Kaufmann Teren Franckowiak, MD       TAKE these medications    aspirin EC 81 MG tablet Take 81 mg by mouth daily. Swallow whole.   cholecalciferol 1000 units tablet Commonly known as: VITAMIN D Take 1,000 Units daily by mouth.   dapagliflozin propanediol 10 MG Tabs tablet Commonly known as: Farxiga Take 1 tablet (10 mg total) by mouth daily before breakfast.   glucose blood test strip Use as instructed   metFORMIN 500 MG tablet Commonly known as: GLUCOPHAGE Take 2 tablets (1,000 mg total) by mouth 2 (two) times daily with a meal.   ONE TOUCH ULTRA SYSTEM KIT w/Device Kit 1 kit by Does not apply route once.   OneTouch Delica Lancets 36R Misc   rosuvastatin 5 MG tablet Commonly known as: Crestor Take 1 tablet (5 mg total) by mouth daily.   Rybelsus 7 MG Tabs Generic drug: Semaglutide Take 7 mg by mouth daily.   VITAMIN B 12 PO Take 1 tablet daily by mouth.         Objective:   BP (!) 141/74   Pulse 84   Temp 98.2 F (36.8 C)   Ht 6' 3"  (1.905 m)   Wt 225 lb (102.1 kg)   SpO2 97%   BMI 28.12 kg/m   Wt Readings from Last  3 Encounters:  11/13/21 225 lb (102.1 kg)  08/08/21 230 lb (104.3 kg)  05/10/21 231 lb (104.8 kg)    Physical Exam Vitals and nursing note reviewed.  Constitutional:      General: He is not in acute distress.    Appearance: He is well-developed. He is not diaphoretic.  Eyes:     General: No scleral icterus.    Conjunctiva/sclera: Conjunctivae normal.  Neck:     Thyroid: No thyromegaly.  Cardiovascular:     Rate and Rhythm: Normal rate and regular rhythm.     Heart sounds: Normal heart sounds. No murmur heard. Pulmonary:     Effort: Pulmonary effort is normal. No respiratory distress.     Breath sounds: Normal breath sounds. No wheezing.  Musculoskeletal:        General: No swelling.     Cervical back: Neck supple.  Lymphadenopathy:     Cervical: No cervical  adenopathy.  Skin:    General: Skin is warm and dry.     Findings: No rash.  Neurological:     Mental Status: He is alert and oriented to person, place, and time.     Coordination: Coordination normal.  Psychiatric:        Behavior: Behavior normal.       Assessment & Plan:   Problem List Items Addressed This Visit       Endocrine   Type 2 diabetes mellitus not at goal [facility]) - Primary   Relevant Medications   dapagliflozin propanediol (FARXIGA) 10 MG TABS tablet   Hyperlipidemia associated with type 2 diabetes mellitus ([place])   Relevant Medications   dapagliflozin propanediol (FARXIGA) 10 MG TABS tablet   Other Visit Diagnoses     Need for shingles vaccine       Relevant Orders   Varicella-zoster vaccine IM (Shingrix) (Completed)     Had blood work at work on 10/21/2021 and A1c was 7.6 and they are going to increase his Rybelsus to 14 mg.  His LDL cholesterol was 81 and is getting closer to goal.  Follow up plan: Return in about 3 months (around 02/13/2022), or if symptoms worsen or fail to improve, for Diabetes and hyperlipidemia recheck.  Counseling provided for all of the vaccine components Orders Placed This Encounter  Procedures   Varicella-zoster vaccine IM (Shingrix)    Caryl Pina, MD [facility] 11/13/2021, 8:35 AM

## 2022-02-14 ENCOUNTER — Ambulatory Visit: Payer: BC Managed Care – PPO | Admitting: Family Medicine

## 2022-02-14 ENCOUNTER — Encounter: Payer: Self-pay | Admitting: Family Medicine

## 2022-02-14 VITALS — BP 140/64 | HR 75 | Temp 98.3°F | Ht 75.0 in | Wt 220.0 lb

## 2022-02-14 DIAGNOSIS — E785 Hyperlipidemia, unspecified: Secondary | ICD-10-CM

## 2022-02-14 DIAGNOSIS — E119 Type 2 diabetes mellitus without complications: Secondary | ICD-10-CM

## 2022-02-14 DIAGNOSIS — E1169 Type 2 diabetes mellitus with other specified complication: Secondary | ICD-10-CM | POA: Diagnosis not present

## 2022-02-14 DIAGNOSIS — Z23 Encounter for immunization: Secondary | ICD-10-CM | POA: Diagnosis not present

## 2022-02-14 MED ORDER — RYBELSUS 14 MG PO TABS: 14.0000 mg | ORAL_TABLET | Freq: Every day | ORAL | 3 refills | Status: DC

## 2022-02-14 NOTE — Progress Notes (Signed)
BP (!) 140/64   Pulse 75   Temp 98.3 F (36.8 C)   Ht 6' 3"  (1.905 m)   Wt 220 lb (99.8 kg)   SpO2 98%   BMI 27.50 kg/m    Subjective:   Patient ID: Randy Mcmahon, male    DOB: [birth year], 61 y.o.   MRN: [id number]  HPI: Randy Mcmahon is a 61 y.o. male presenting on 02/14/2022 for Medical Management of Chronic Issues, Hyperlipidemia, and Diabetes   HPI Type 2 diabetes mellitus Patient comes in today for recheck of his diabetes. Patient has been currently taking [place] and metformin and Rybelsus. Patient is not currently on an ACE inhibitor/ARB. Patient has seen an ophthalmologist this year. Patient denies any issues with their feet. The symptom started onset as an adult hyperlipidemia ARE RELATED TO DM   Hyperlipidemia Patient is coming in for recheck of his hyperlipidemia. The patient is currently taking Crestor. They deny any issues with myalgias or history of liver damage from it. They deny any focal numbness or weakness or chest pain.   Relevant past medical, surgical, family and social history reviewed and updated as indicated. Interim medical history since our last visit reviewed. Allergies and medications reviewed and updated.  Review of Systems  Constitutional:  Negative for chills and fever.  Eyes:  Negative for visual disturbance.  Respiratory:  Negative for shortness of breath and wheezing.   Cardiovascular:  Negative for chest pain and leg swelling.  Musculoskeletal:  Negative for back pain and gait problem.  Skin:  Negative for rash.  Neurological:  Negative for dizziness, weakness and numbness.  All other systems reviewed and are negative.   Per HPI unless specifically indicated above   Allergies as of 02/14/2022   No Known Allergies      Medication List        Accurate as of February 14, 2022  8:39 AM. If you have any questions, ask your nurse or doctor.          aspirin EC 81 MG tablet Take 81 mg by mouth daily. Swallow whole.    cholecalciferol 1000 units tablet Commonly known as: VITAMIN D Take 1,000 Units daily by mouth.   dapagliflozin propanediol 10 MG Tabs tablet Commonly known as: Farxiga Take 1 tablet (10 mg total) by mouth daily before breakfast.   glucose blood test strip Use as instructed   metFORMIN 500 MG tablet Commonly known as: GLUCOPHAGE Take 2 tablets (1,000 mg total) by mouth 2 (two) times daily with a meal.   ONE TOUCH ULTRA SYSTEM KIT w/Device Kit 1 kit by Does not apply route once.   OneTouch Delica Lancets 19E Misc   rosuvastatin 5 MG tablet Commonly known as: Crestor Take 1 tablet (5 mg total) by mouth daily.   Rybelsus 14 MG Tabs Generic drug: Semaglutide Take 1 tablet (14 mg total) by mouth daily. What changed:  medication strength how much to take Changed by: Fransisca Kaufmann Hiren Peplinski, MD   VITAMIN B 12 PO Take 1 tablet daily by mouth.         Objective:   BP (!) 140/64   Pulse 75   Temp 98.3 F (36.8 C)   Ht 6' 3"  (1.905 m)   Wt 220 lb (99.8 kg)   SpO2 98%   BMI 27.50 kg/m   Wt Readings from Last 3 Encounters:  02/14/22 220 lb (99.8 kg)  11/13/21 225 lb (102.1 kg)  08/08/21 230 lb (104.3 kg)  Physical Exam Vitals and nursing note reviewed.  Constitutional:      General: He is not in acute distress.    Appearance: He is well-developed. He is not diaphoretic.  Eyes:     General: No scleral icterus.    Conjunctiva/sclera: Conjunctivae normal.  Neck:     Thyroid: No thyromegaly.  Cardiovascular:     Rate and Rhythm: Normal rate and regular rhythm.     Heart sounds: Normal heart sounds. No murmur heard. Pulmonary:     Effort: Pulmonary effort is normal. No respiratory distress.     Breath sounds: Normal breath sounds. No wheezing.  Musculoskeletal:        General: Normal range of motion.     Cervical back: Neck supple.  Lymphadenopathy:     Cervical: No cervical adenopathy.  Skin:    General: Skin is warm and dry.     Findings: No rash.   Neurological:     Mental Status: He is alert and oriented to person, place, and time.     Coordination: Coordination normal.  Psychiatric:        Behavior: Behavior normal.     A1c was 7.3 at work, made a copy of the labs.  Assessment & Plan:   Problem List Items Addressed This Visit       Endocrine   Type 2 diabetes mellitus not at goal [facility])   Relevant Medications   Semaglutide (RYBELSUS) 14 MG TABS   Other Relevant Orders   Bayer DCA Hb A1c Waived   Hyperlipidemia associated with type 2 diabetes mellitus ([place])   Relevant Medications   Semaglutide (RYBELSUS) 14 MG TABS   Other Visit Diagnoses     Need for shingles vaccine    -  Primary   Relevant Orders   Zoster Recombinant (Shingrix ) (Completed)     A1c improved to 7.3, going in the right direction, the Rybelsus seems to be doing well for him.  He continues to focus on diet.  Follow up plan: Return in about 3 months (around 05/16/2022), or if symptoms worsen or fail to improve, for Diabetes and cholesterol recheck.  Counseling provided for all of the vaccine components Orders Placed This Encounter  Procedures   Zoster Recombinant (Shingrix )   Bayer [facility] Hb A1c Donald, MD [facility] 02/14/2022, 8:39 AM

## 2022-04-18 NOTE — Unmapped (Signed)
Formatting of this note might be different from the original.  Follow all instructions from the office.  DOS medicines discussed.  Do not wear/bring jewelry/valuables to the facility.  We are a tobacco free and firearm free facility inside and out. Please keep these products at home.  Notify your procedure doctor's office should you need to reschedule.  Bring your insurance card and a picture ID.  Urine pregnancy(when applicable) will be needed prior to your procedure.  Arrival time and directions to the facility were discussed.  You do need a driver for the day of the procedure.  For your safety, it is strongly recommended that you have someone stay with you after your procedure.  Patient/caregiver verbalized understanding for all instructions.  Patient instructed to follow prep instructions. Results should be clear. Definition of clear discussed. Patient verbalized understanding.    Electronically signed by Rosana Hoes, Registered Nurse at 04/18/2022 10:46 AM EST

## 2022-04-21 NOTE — H&P (Signed)
Formatting of this note is different from the original.  Gastroenterology Outpatient History and Physical    Patient: David Roman MRN: [id number]  Sex: male    Date of Birth: May 05, 1961  Age: 61 year old  Location: [facility]     Date:04/21/2022  Primary Care Physician: Floreen Comber, DO         Patient: David Roman    Physician: Redge Gainer. Manegold, MD    Vital Signs: BP 137/80   Pulse 61   Temp 98.2 F (36.8 C) (Temporal)   Resp 16   Ht 71" (180.3 cm)   Wt 225 lb (102.1 kg)   SpO2 98%   BMI 31.38 kg/m     Allergies: No Known Allergies    History of Present Illness:  History of colonic polyps      History:  Past Medical History:   Diagnosis Date   ? Arthropathy    ? Gout    ? HLD (hyperlipidemia)    ? OSA (obstructive sleep apnea)      Past Surgical History:   Procedure Laterality Date   ? COLONOSCOPY  2013    polyps   ? COLONOSCOPY W/ OR W/O BIOPSY N/A 05/05/2017    5 year recall for colonoscopy/hx of polyps   ? 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 JOINT REPLACEMENT Left 10/27/2017    knee   ? TONSILLECTOMY       Social History     Socioeconomic History   ? Marital status: Married   Tobacco Use   ? Smoking status: Former     Current packs/day: 0.00     Average packs/day: 0.3 packs/day for 30.0 years (7.5 ttl pk-yrs)     Types: Cigarettes     Start date: 05/06/1987     Quit date: 05/05/2017     Years since quitting: 4.9   ? Smokeless tobacco: Never   ? Tobacco comments:     once a month   Substance and Sexual Activity   ? Alcohol use: Yes     Comment: social   ? Drug use: Never     History reviewed. No pertinent family history.    Medications:   Prior to Admission medications    Medication Sig Start Date End Date Taking? Authorizing Provider   ascorbic acid 250 MG TABS Take 250 mg by mouth daily.   Yes HISTORICAL MED   Cholecalciferol (VITAMIN D3) 125 MCG (5000 UT) TABS Take  by mouth.   Yes HISTORICAL MED   colchicine (COLCRYS) 0.6 MG tablet Take 0.6 mg  by mouth daily. 06/07/21  Yes HISTORICAL MED   aspirin 81 mg CHEW 81 mg by Chewable route daily.   Yes HISTORICAL MED   rosuvastatin (CRESTOR) 20 MG TABS Take 20 mg by mouth nightly. 03/05/22  Yes HISTORICAL MED   meloxicam (MOBIC) 15 MG TABS Take 15 mg by mouth daily. 02/22/22  Yes HISTORICAL MED   allopurinol (ZYLOPRIM) 300 MG TABS Take 300 mg by mouth daily. 02/24/22  Yes HISTORICAL MED   diphenhydrAMINE-acetaminophen (TYLENOL PM) 25-500 MG TABS Take 1 tablet by mouth nightly as needed.   Yes HISTORICAL MED     Physical Exam:   Heart: within normal limits   Lungs: clear to auscultation in anterior lung fields bilaterally   Mental status:  Within normal limits     ASA score:  2    Mallimpati score:  2    Sedation  planned: MAC    Procedure: Procedure(s) with comments:  MAC COLONOSCOPY POSSIBLE BIOPSY AND/OR POLYPECTOMY - MAC COLONOSCOPY POSSIBLE BIOPSY AND/OR POLYPECTOMY    Signed By: Redge Gainer. Lorrene Reid, MD     April 21, 2022      Electronically signed by Irena Cords., MD at 04/21/2022  7:59 AM EST

## 2022-04-21 NOTE — Procedures (Signed)
Formatting of this note is different from the original.  Procedure Note    Patient: David Roman MRN: [id number]     Date of Birth: 09/02/1960  Age: 61 year old  Sex: male    Unit: [facility] ENDOSCOPY Room/Bed: GWEND/PB Location: [facility]     Date of Procedure: 04/21/2022     Preoperative Diagnosis: Surveillance colonoscopy for history of colonic polyps    Postoperative Diagnosis: Same    Sedation: MAC    Procedure: Colonoscopy    Indications: History of colonic polyps      Description of Procedure: The digital and anal exams were remarkable for external skin tags.  The colonoscope was inserted to the cecum.  The prep was good.  There was incidental sigmoid diverticulosis.  No mucosal lesions were identified.    Estimated Blood Loss: None    Complications: None  Event Time In   Procedure Start(Endo/Cath/IR/EP) 0809   Procedure Finish (Endo/Cath/IR/EP) 3212       Findings: Sigmoid diverticulosis only    Plan:  1. Surveillance colonoscopy in 5 years        This dictation has not proof read.  Signed By: Redge Gainer Lorrene Reid, MD 04/21/2022 8:27 AM  Electronically signed by Irena Cords., MD at 04/21/2022  8:28 AM EST

## 2022-05-13 ENCOUNTER — Other Ambulatory Visit: Payer: Self-pay | Admitting: Family Medicine

## 2022-05-13 DIAGNOSIS — R739 Hyperglycemia, unspecified: Secondary | ICD-10-CM

## 2022-05-13 DIAGNOSIS — E1169 Type 2 diabetes mellitus with other specified complication: Secondary | ICD-10-CM

## 2022-05-29 ENCOUNTER — Encounter: Payer: Self-pay | Admitting: Family Medicine

## 2022-05-29 ENCOUNTER — Ambulatory Visit: Payer: BC Managed Care – PPO | Admitting: Family Medicine

## 2022-05-29 VITALS — BP 136/75 | HR 89 | Temp 97.4°F | Ht 75.0 in | Wt 223.0 lb

## 2022-05-29 DIAGNOSIS — Z7984 Long term (current) use of oral hypoglycemic drugs: Secondary | ICD-10-CM

## 2022-05-29 DIAGNOSIS — E1169 Type 2 diabetes mellitus with other specified complication: Secondary | ICD-10-CM | POA: Diagnosis not present

## 2022-05-29 DIAGNOSIS — E785 Hyperlipidemia, unspecified: Secondary | ICD-10-CM

## 2022-05-29 DIAGNOSIS — E119 Type 2 diabetes mellitus without complications: Secondary | ICD-10-CM

## 2022-05-29 NOTE — Progress Notes (Signed)
BP 136/75   Pulse 89   Temp (!) 97.4 F (36.3 C)   Ht 6\' 3"  (1.905 m)   Wt 223 lb (101.2 kg)   SpO2 98%   BMI 27.87 kg/m    Subjective:   Patient ID: Randy Mcmahon, male    DOB: March 11, 1961, 62 y.o.   MRN: [id number]  HPI: Randy Mcmahon is a 62 y.o. male presenting on 05/29/2022 for Medical Management of Chronic Issues, Diabetes, Hypertension, and Hyperlipidemia   HPI Type 2 diabetes mellitus Patient comes in today for recheck of his diabetes. Patient has been currently taking metformin and Rybelsus and Farxiga, A1c through his workplace was 7.2. Patient is not currently on an ACE inhibitor/ARB. Patient has not seen an ophthalmologist this year. Patient denies any issues with their feet. The symptom started onset as an adult hyperlipidemia ARE RELATED TO DM   Hyperlipidemia Patient is coming in for recheck of his hyperlipidemia. The patient is currently taking Crestor. They deny any issues with myalgias or history of liver damage from it. They deny any focal numbness or weakness or chest pain.   Relevant past medical, surgical, family and social history reviewed and updated as indicated. Interim medical history since our last visit reviewed. Allergies and medications reviewed and updated.  Review of Systems  Constitutional:  Negative for chills and fever.  Eyes:  Negative for visual disturbance.  Respiratory:  Negative for shortness of breath and wheezing.   Cardiovascular:  Negative for chest pain and leg swelling.  Musculoskeletal:  Negative for back pain and gait problem.  Skin:  Negative for rash.  Neurological:  Negative for dizziness, weakness and light-headedness.  All other systems reviewed and are negative.   Per HPI unless specifically indicated above   Allergies as of 05/29/2022   No Known Allergies      Medication List        Accurate as of May 29, 2022  8:46 AM. If you have any questions, ask your nurse or doctor.          STOP taking  these medications    aspirin EC 81 MG tablet Stopped by: Fransisca Kaufmann Lin Hackmann, MD       TAKE these medications    cholecalciferol 1000 units tablet Commonly known as: VITAMIN D Take 1,000 Units daily by mouth.   dapagliflozin propanediol 10 MG Tabs tablet Commonly known as: Farxiga Take 1 tablet (10 mg total) by mouth daily before breakfast.   glucose blood test strip Use as instructed   metFORMIN 500 MG tablet Commonly known as: GLUCOPHAGE TAKE 2 TABLETS BY MOUTH TWICE DAILY WITH A MEAL   ONE TOUCH ULTRA SYSTEM KIT w/Device Kit 1 kit by Does not apply route once.   OneTouch Delica Lancets 41D Misc   rosuvastatin 10 MG tablet Commonly known as: CRESTOR Take 10 mg by mouth daily. What changed: Another medication with the same name was removed. Continue taking this medication, and follow the directions you see here. Changed by: Fransisca Kaufmann Aeneas Longsworth, MD   Rybelsus 14 MG Tabs Generic drug: Semaglutide Take 1 tablet (14 mg total) by mouth daily.   VITAMIN B 12 PO Take 1 tablet daily by mouth.         Objective:   BP 136/75   Pulse 89   Temp (!) 97.4 F (36.3 C)   Ht 6\' 3"  (1.905 m)   Wt 223 lb (101.2 kg)   SpO2 98%   BMI 27.87 kg/m  Wt Readings from Last 3 Encounters:  05/29/22 223 lb (101.2 kg)  02/14/22 220 lb (99.8 kg)  11/13/21 225 lb (102.1 kg)    Physical Exam Vitals and nursing note reviewed.  Constitutional:      General: He is not in acute distress.    Appearance: He is well-developed. He is not diaphoretic.  Eyes:     General: No scleral icterus.    Conjunctiva/sclera: Conjunctivae normal.  Neck:     Thyroid: No thyromegaly.  Cardiovascular:     Rate and Rhythm: Normal rate and regular rhythm.     Heart sounds: Normal heart sounds. No murmur heard. Pulmonary:     Effort: Pulmonary effort is normal. No respiratory distress.     Breath sounds: Normal breath sounds. No wheezing.  Musculoskeletal:        General: No swelling. Normal range  of motion.     Cervical back: Neck supple.  Lymphadenopathy:     Cervical: No cervical adenopathy.  Skin:    General: Skin is warm and dry.     Findings: No rash.  Neurological:     Mental Status: He is alert and oriented to person, place, and time.     Coordination: Coordination normal.  Psychiatric:        Behavior: Behavior normal.       Assessment & Plan:   Problem List Items Addressed This Visit       Endocrine   Type 2 diabetes mellitus not at goal [facility]) - Primary   Relevant Medications   rosuvastatin (CRESTOR) 10 MG tablet   Other Relevant Orders   Microalbumin / creatinine urine ratio   Hyperlipidemia associated with type 2 diabetes mellitus ([place])   Relevant Medications   rosuvastatin (CRESTOR) 10 MG tablet    Continue current medicine, they did start the Crestor if it is work and I agree with that that he should be taking it consistently. Follow up plan: Return in about 3 months (around 08/28/2022), or if symptoms worsen or fail to improve, for Diabetes recheck.  Counseling provided for all of the vaccine components Orders Placed This Encounter  Procedures   Microalbumin / creatinine urine ratio    Caryl Pina, MD [facility] 05/29/2022, 8:46 AM

## 2022-07-15 LAB — LAB REPORT - SCANNED
A1c: 7.1
EGFR: 103

## 2022-07-17 ENCOUNTER — Telehealth: Payer: Self-pay

## 2022-07-17 NOTE — Telephone Encounter (Signed)
Randy Mcmahon (Key: [id number]) Rybelsus '7MG'$  tablets Form [employer] Electronic Prior Authorization Form (2017 NCPDP) Created 22 hours ago Sent to Plan 9 minutes ago Plan Response 8 minutes ago Submit Clinical Questions 1 minute ago Determination Wait for Determination Please wait for [employer] 2017 NCPDP to return a determination.

## 2022-07-18 NOTE — Telephone Encounter (Signed)
Patient Advocate Encounter  Prior Authorization for Rybelsus '7MG'$  tablets has been approved.    PA# [id number] Authorization Expiration Date: July 17, 2023.

## 2022-08-01 MED ORDER — ROSUVASTATIN CALCIUM 20 MG PO TABS
20 | ORAL_TABLET | Freq: Every day | ORAL | 3 refills | Status: AC
Start: 2022-08-01 — End: ?

## 2022-08-01 NOTE — Telephone Encounter (Signed)
Last O/V:  09/04/21  Next O/V:  09/04/22  Last Refill: 09/04/21   Last Labs:  BMP:  06/01/19  Last EKG: 09/04/21    Needs updated labs

## 2022-08-22 DIAGNOSIS — R0789 Other chest pain: Secondary | ICD-10-CM

## 2022-09-03 ENCOUNTER — Encounter: Payer: Self-pay | Admitting: Family Medicine

## 2022-09-03 ENCOUNTER — Ambulatory Visit: Payer: BC Managed Care – PPO | Admitting: Family Medicine

## 2022-09-03 VITALS — BP 123/65 | HR 81 | Ht 75.0 in | Wt 219.0 lb

## 2022-09-03 DIAGNOSIS — E785 Hyperlipidemia, unspecified: Secondary | ICD-10-CM

## 2022-09-03 DIAGNOSIS — E119 Type 2 diabetes mellitus without complications: Secondary | ICD-10-CM

## 2022-09-03 DIAGNOSIS — E1169 Type 2 diabetes mellitus with other specified complication: Secondary | ICD-10-CM

## 2022-09-03 MED ORDER — DAPAGLIFLOZIN PROPANEDIOL 10 MG PO TABS: 10.0000 mg | ORAL_TABLET | Freq: Every day | ORAL | 3 refills | Status: DC

## 2022-09-03 MED ORDER — RYBELSUS 14 MG PO TABS: 14.0000 mg | ORAL_TABLET | Freq: Every day | ORAL | 3 refills | Status: DC

## 2022-09-03 NOTE — Progress Notes (Signed)
BP 123/65   Pulse 81   Ht  (1.905 m)   Wt 219 lb (99.3 kg)   SpO2 99%   BMI 27.37 kg/m    Subjective:   Patient ID: Randy Mcmahon, male    DOB: 05/25/1960, 62 y.o.   MRN: [id number]  HPI: Randy Mcmahon is a 62 y.o. male presenting on 09/03/2022 for Medical Management of Chronic Issues and Diabetes   HPI Type 2 diabetes mellitus Patient comes in today for recheck of his diabetes. Patient has been currently taking Rybelsus and metformin and [place]. Patient is not currently on an ACE inhibitor/ARB. Patient has not seen an ophthalmologist this year. Patient denies any new issues with their feet. The symptom started onset as an adult hyperlipidemia ARE RELATED TO DM   Hyperlipidemia Patient is coming in for recheck of his hyperlipidemia. The patient is currently taking Crestor. They deny any issues with myalgias or history of liver damage from it. They deny any focal numbness or weakness or chest pain.   Relevant past medical, surgical, family and social history reviewed and updated as indicated. Interim medical history since our last visit reviewed. Allergies and medications reviewed and updated.  Review of Systems  Constitutional:  Negative for chills and fever.  Eyes:  Negative for visual disturbance.  Respiratory:  Negative for shortness of breath and wheezing.   Cardiovascular:  Negative for chest pain and leg swelling.  Musculoskeletal:  Negative for back pain and gait problem.  Skin:  Negative for rash.  Neurological:  Negative for dizziness, weakness and light-headedness.  All other systems reviewed and are negative.   Per HPI unless specifically indicated above   Allergies as of 09/03/2022   No Known Allergies      Medication List        Accurate as of September 03, 2022  8:39 AM. If you have any questions, ask your nurse or doctor.          cholecalciferol 1000 units tablet Commonly known as: VITAMIN D Take 1,000 Units daily by mouth.    dapagliflozin propanediol 10 MG Tabs tablet Commonly known as: Farxiga Take 1 tablet (10 mg total) by mouth daily before breakfast.   glucose blood test strip Use as instructed   metFORMIN 500 MG tablet Commonly known as: GLUCOPHAGE TAKE 2 TABLETS BY MOUTH TWICE DAILY WITH A MEAL   ONE TOUCH ULTRA SYSTEM KIT w/Device Kit 1 kit by Does not apply route once.   OneTouch Delica Lancets 33G Misc   rosuvastatin 10 MG tablet Commonly known as: CRESTOR Take 10 mg by mouth daily.   Rybelsus 14 MG Tabs Generic drug: Semaglutide Take 1 tablet (14 mg total) by mouth daily.   VITAMIN B 12 PO Take 1 tablet daily by mouth.         Objective:   BP 123/65   Pulse 81   Ht  (1.905 m)   Wt 219 lb (99.3 kg)   SpO2 99%   BMI 27.37 kg/m   Wt Readings from Last 3 Encounters:  09/03/22 219 lb (99.3 kg)  05/29/22 223 lb (101.2 kg)  02/14/22 220 lb (99.8 kg)    Physical Exam Vitals and nursing note reviewed.  Constitutional:      General: He is not in acute distress.    Appearance: He is well-developed. He is not diaphoretic.  Eyes:     General: No scleral icterus.    Conjunctiva/sclera: Conjunctivae normal.  Neck:  Thyroid: No thyromegaly.  Cardiovascular:     Rate and Rhythm: Normal rate and regular rhythm.     Heart sounds: Normal heart sounds. No murmur heard. Pulmonary:     Effort: Pulmonary effort is normal. No respiratory distress.     Breath sounds: Normal breath sounds. No wheezing.  Musculoskeletal:        General: No swelling. Normal range of motion.     Cervical back: Neck supple.  Lymphadenopathy:     Cervical: No cervical adenopathy.  Skin:    General: Skin is warm and dry.     Findings: No rash.  Neurological:     Mental Status: He is alert and oriented to person, place, and time.     Coordination: Coordination normal.  Psychiatric:        Behavior: Behavior normal.       Assessment & Plan:   Problem List Items Addressed This Visit        Endocrine   Type 2 diabetes mellitus not at goal - Primary   Relevant Medications   dapagliflozin propanediol (FARXIGA) 10 MG TABS tablet   Semaglutide (RYBELSUS) 14 MG TABS   Hyperlipidemia associated with type 2 diabetes mellitus   Relevant Medications   dapagliflozin propanediol (FARXIGA) 10 MG TABS tablet   Semaglutide (RYBELSUS) 14 MG TABS    Had blood work done through work, A1c was 7.1 which is slightly better than last time at 7.2.  He says he just did a urine microalbumin earlier today.  The rest of his blood work looks pretty good.  Blood pressure looks pretty good today.  No changes. Follow up plan: Return in about 3 months (around 12/03/2022), or if symptoms worsen or fail to improve, for Diabetes and hyperlipidemia recheck.  Counseling provided for all of the vaccine components No orders of the defined types were placed in this encounter.   Arville Care, MD [facility] 09/03/2022, 8:39 AM

## 2022-09-04 ENCOUNTER — Ambulatory Visit
Admit: 2022-09-04 | Discharge: 2022-09-04 | Payer: BLUE CROSS/BLUE SHIELD | Attending: Cardiovascular Disease | Primary: Family Medicine

## 2022-09-04 DIAGNOSIS — R0789 Other chest pain: Secondary | ICD-10-CM

## 2022-09-04 NOTE — Progress Notes (Unsigned)
[facility]    David Roman  02/17/1961    August 22, 2022    Reason for Consult: Elevated calcium score     CC: "    HPI:  The patient is 62 y.o. male with a past medical history significant for OSA who presents for evaluation of an elevated calcium score of 488. He is accompanied by his wife and states that overall he is feeling well. He reports an occasional "twinge" in his chest but would not describe it as pain. The episodes occur at random and resolve within seconds. The episodes are not associated with exertion and resolve without any particular intervention. He state he was a former smoker. He states that his brother had heart disease and passed away in his 30s. His wife has his labs pulled up on MyChart and his LDL is 121. He reports medication compliance and is tolerating. He denies any abnormal bleeding or bruising. He denies exertional chest pain/pressure, dyspnea at rest, worsening DOE, PND, orthopnea, palpitations, lightheadedness, weight changes, changes in LE edema, and syncope.    Review of Systems:  Constitutional: No fatigue, weakness, night sweats or fever.   HEENT: No new vision difficulties or ringing in the ears.  Respiratory: No new SOB, PND, orthopnea or cough.   Cardiovascular: See HPI   GI: No n/v, diarrhea, constipation, abdominal pain or changes in bowel habits.  No melena, no hematochezia  GU: No urinary frequency, urgency, incontinence, hematuria or dysuria.  Skin: No cyanosis or skin lesions.  Musculoskeletal: No new muscle or joint pain.  Neurological: No syncope or TIA-like symptoms.  Psychiatric: No anxiety, insomnia or depression     No past medical history on file.  No past surgical history on file.  Family History   Problem Relation Age of Onset    Hypertension Brother      Social History     Tobacco Use    Smoking status: Never     Passive exposure: Never    Smokeless tobacco: Never   Substance Use Topics    Alcohol use: Never    Drug use: Never        No Known Allergies  Current Outpatient Medications   Medication Sig Dispense Refill    rosuvastatin (CRESTOR) 20 MG tablet TAKE 1 TABLET BY MOUTH DAILY 30 tablet 3    allopurinol (ZYLOPRIM) 300 MG tablet Take 1 tablet by mouth daily Take 1 tablet daily      Ascorbic Acid (VITAMIN C) 250 MG tablet Take 1 tablet by mouth daily Take 1 tablet daily      Cholecalciferol (VITAMIN D3) 125 MCG (5000 UT) TABS Take by mouth Take 1 tablet daily      colchicine (COLCRYS) 0.6 MG tablet Take 1 tablet by mouth daily Take 1 tablet daily      meloxicam (MOBIC) 15 MG tablet Take 1 tablet by mouth daily Take 1 tablet daily      Multiple Vitamins-Minerals (MULTIVITAMIN ADULT EXTRA C PO) Take by mouth Take 1 tablet daily      zolpidem (AMBIEN) 5 MG tablet Take 1 tablet by mouth nightly as needed for Sleep. Take 1 tablet daily/ prn Max Daily Amount: 5 mg       No current facility-administered medications for this visit.       Physical Exam:   There were no vitals taken for this visit.  No intake or output data in the 24 hours ending 08/22/22 1108  Wt Readings from Last 2 Encounters:  09/04/21 106.6 kg (235 lb)     Constitutional: He is oriented to person, place, and time. He appears well-developed and well-nourished. In no acute distress.   Head: Normocephalic and atraumatic.     Neck: Neck supple. No JVD present. Carotid bruit is not present. No mass and no thyromegaly present. No lymphadenopathy present.  Cardiovascular: Normal rate, regular rhythm, normal heart sounds and intact distal pulses.  Exam reveals no gallop and no friction rub.  No murmur heard.  Pulmonary/Chest: Effort normal and breath sounds normal. No respiratory distress. He has no wheezes, rhonchi or rales.   Abdominal: Soft, non-tender. Bowel sounds and aorta are normal. He exhibits no organomegaly, mass or bruit.   Extremities: No edema, cyanosis, or clubbing. Pulses are 2+ radial/carotid/dorsalis pedis and posterior tibial bilaterally.  Neurological: He is  alert and oriented to person, place, and time. He has normal reflexes. No cranial nerve deficit. Coordination normal.   Skin: Skin is warm and dry. There is no rash or diaphoresis.   Psychiatric: He has a normal mood and affect. His speech is normal and behavior is normal.     Personally reviewed and interpreted   EKG Interpretation: 09/04/21 Sinus rhythm     NM Stress 09/27/21   There is a small sized, mild intensity, minimally reversible inferior apical   wall defect which is most consistent with diaphragmatic attenuation   artifact.   No ischemic ECG changes with exercise and no reported chest pain.   Study was not gated secondary to frequent PVCs at rest and throughout   exercise.     Stress Protocols      Resting ECG   Normal sinus rhythm with frequent PVCs.   Right bundle branch block.    Assessment:  1. Elevated calcium score/atypical chest pain  2. OSA  3. Hyperlipidemia   4. Former smoker     Plan:  Given his elevated calcium score and his recent LDL being 121, I will increase Crestor to 20 mg daily. I will have him repeat a fasting lipid profile when he follows up with his PCP. His chest pain is likely noncardiac based on description, but given his elevated score I will further risk stratify with a NM stress test. His blood pressure is well controlled in office today. I have encouraged him to increase his aerobic activity as tolerated and adhere to a heart healthy diet. I have personally reviewed all previous testing for this visit today including imaging, lab results and EKG as detailed above.     I will see him in office for follow up in 1 year.       This note was scribed in the presence of Dr. Jessee Avers, MD by Rachelle Hora RN.

## 2022-12-05 ENCOUNTER — Ambulatory Visit: Payer: BC Managed Care – PPO | Admitting: Family Medicine

## 2022-12-29 ENCOUNTER — Encounter: Payer: Self-pay | Admitting: Family Medicine

## 2022-12-29 ENCOUNTER — Ambulatory Visit: Payer: BC Managed Care – PPO | Admitting: Family Medicine

## 2022-12-29 VITALS — BP 121/65 | HR 90 | Temp 97.5°F | Resp 20 | Ht 75.0 in | Wt 219.0 lb

## 2022-12-29 DIAGNOSIS — Z7984 Long term (current) use of oral hypoglycemic drugs: Secondary | ICD-10-CM

## 2022-12-29 DIAGNOSIS — E1169 Type 2 diabetes mellitus with other specified complication: Secondary | ICD-10-CM

## 2022-12-29 DIAGNOSIS — E119 Type 2 diabetes mellitus without complications: Secondary | ICD-10-CM

## 2022-12-29 DIAGNOSIS — E785 Hyperlipidemia, unspecified: Secondary | ICD-10-CM | POA: Diagnosis not present

## 2022-12-29 DIAGNOSIS — R739 Hyperglycemia, unspecified: Secondary | ICD-10-CM

## 2022-12-29 MED ORDER — METFORMIN HCL 500 MG PO TABS: 500.0000 mg | ORAL_TABLET | Freq: Every day | ORAL | 3 refills | Status: DC

## 2022-12-29 NOTE — Progress Notes (Signed)
BP 121/65   Pulse 90   Temp (!) 97.5 F (36.4 C) (Temporal)   Resp 20   Ht 6\' 3"  (1.905 m)   Wt 219 lb (99.3 kg)   SpO2 97%   BMI 27.37 kg/m    Subjective:   Patient ID: Randy Mcmahon, male    DOB: [birth year], 62 y.o.   MRN: [id number]  HPI: Randy Mcmahon is a 62 y.o. male presenting on 12/29/2022 for Medical Management of Chronic Issues   HPI Type 2 diabetes mellitus Patient comes in today for recheck of his diabetes. Patient has been currently taking Rybelsus and metformin and [place].  He admits he has been eating a lot more carbs, especially chicken wings that are breaded and fried.  His A1c was done through his workplace in June and showed that it was up at 7.8.. Patient is not currently on an ACE inhibitor/ARB. Patient has not seen an ophthalmologist this year. Patient denies any new issues with their feet. The symptom started onset as an adult hyperlipidemia ARE RELATED TO DM   Hyperlipidemia Patient is coming in for recheck of his hyperlipidemia. The patient is currently taking Crestor. They deny any issues with myalgias or history of liver damage from it. They deny any focal numbness or weakness or chest pain.   Relevant past medical, surgical, family and social history reviewed and updated as indicated. Interim medical history since our last visit reviewed. Allergies and medications reviewed and updated.  Review of Systems  Constitutional:  Negative for chills and fever.  Eyes:  Negative for visual disturbance.  Respiratory:  Negative for shortness of breath and wheezing.   Cardiovascular:  Negative for chest pain and leg swelling.  Musculoskeletal:  Negative for back pain and gait problem.  Skin:  Negative for rash.  Neurological:  Negative for dizziness and light-headedness.  All other systems reviewed and are negative.   Per HPI unless specifically indicated above   Allergies as of 12/29/2022   No Known Allergies      Medication List         Accurate as of December 29, 2022  9:01 AM. If you have any questions, ask your nurse or doctor.          cholecalciferol 1000 units tablet Commonly known as: VITAMIN D Take 1,000 Units daily by mouth.   dapagliflozin propanediol 10 MG Tabs tablet Commonly known as: Farxiga Take 1 tablet (10 mg total) by mouth daily before breakfast.   glucose blood test strip Use as instructed   metFORMIN 500 MG tablet Commonly known as: GLUCOPHAGE Take 1 tablet (500 mg total) by mouth daily with breakfast. What changed: See the new instructions. Changed by: Elige Radon Genola Yuille   ONE TOUCH ULTRA SYSTEM KIT w/Device Kit 1 kit by Does not apply route once.   OneTouch Delica Lancets 33G Misc   rosuvastatin 20 MG tablet Commonly known as: CRESTOR Take 20 mg by mouth at bedtime. What changed: Another medication with the same name was removed. Continue taking this medication, and follow the directions you see here. Changed by: Elige Radon Ketura Sirek   Rybelsus 14 MG Tabs Generic drug: Semaglutide Take 1 tablet (14 mg total) by mouth daily.   VITAMIN B 12 PO Take 1 tablet daily by mouth.         Objective:   BP 121/65   Pulse 90   Temp (!) 97.5 F (36.4 C) (Temporal)   Resp 20   Ht 6\' 3"  (1.905  m)   Wt 219 lb (99.3 kg)   SpO2 97%   BMI 27.37 kg/m   Wt Readings from Last 3 Encounters:  12/29/22 219 lb (99.3 kg)  09/03/22 219 lb (99.3 kg)  05/29/22 223 lb (101.2 kg)    Physical Exam Vitals and nursing note reviewed.  Constitutional:      General: He is not in acute distress.    Appearance: He is well-developed. He is not diaphoretic.  Eyes:     General: No scleral icterus.    Conjunctiva/sclera: Conjunctivae normal.  Neck:     Thyroid: No thyromegaly.  Cardiovascular:     Rate and Rhythm: Normal rate and regular rhythm.     Heart sounds: Normal heart sounds. No murmur heard. Pulmonary:     Effort: Pulmonary effort is normal. No respiratory distress.     Breath sounds:  Normal breath sounds. No wheezing.  Musculoskeletal:        General: No swelling. Normal range of motion.     Cervical back: Neck supple.  Lymphadenopathy:     Cervical: No cervical adenopathy.  Skin:    General: Skin is warm and dry.     Findings: No rash.  Neurological:     Mental Status: He is alert and oriented to person, place, and time.     Coordination: Coordination normal.  Psychiatric:        Behavior: Behavior normal.       Assessment & Plan:   Problem List Items Addressed This Visit       Endocrine   Type 2 diabetes mellitus not at goal [facility]) - Primary   Relevant Medications   rosuvastatin (CRESTOR) 20 MG tablet   metFORMIN (GLUCOPHAGE) 500 MG tablet   Hyperlipidemia associated with type 2 diabetes mellitus (HCC)   Relevant Medications   rosuvastatin (CRESTOR) 20 MG tablet   metFORMIN (GLUCOPHAGE) 500 MG tablet   Other Visit Diagnoses     Elevated blood sugar       Relevant Medications   metFORMIN (GLUCOPHAGE) 500 MG tablet   Type 2 diabetes mellitus with other specified complication, without long-term current use of insulin (HCC)       Relevant Medications   rosuvastatin (CRESTOR) 20 MG tablet   metFORMIN (GLUCOPHAGE) 500 MG tablet       Continue current med for now, he wants to try diet and see if he can get down on his own.  A1c was 7.8.  His vitamin D was slightly low but he was already discussed increasing that through his workplace.  His cholesterol numbers look pretty decent and they did increase his Crestor to 20 mg trying to get his LDL below 70 Follow up plan: Return in about 3 months (around 03/31/2023), or if symptoms worsen or fail to improve, for Diabetes recheck.  Counseling provided for all of the vaccine components No orders of the defined types were placed in this encounter.   Arville Care, MD [facility] 12/29/2022, 9:01 AM

## 2023-01-28 ENCOUNTER — Telehealth: Payer: Self-pay | Admitting: Family Medicine

## 2023-02-13 MED ORDER — ROSUVASTATIN CALCIUM 20 MG PO TABS
20 | ORAL_TABLET | Freq: Every day | ORAL | 3 refills | Status: DC
Start: 2023-02-13 — End: 2023-06-19

## 2023-02-13 NOTE — Telephone Encounter (Signed)
 Last OV: 09/04/2022  Last Labs: 06/01/2019  Last Refills: 08/01/2022  Next Appt: None scheduled  Last EKG: 09/04/2022

## 2023-03-30 ENCOUNTER — Encounter: Payer: Self-pay | Admitting: Internal Medicine

## 2023-04-02 ENCOUNTER — Encounter: Payer: Self-pay | Admitting: Family Medicine

## 2023-04-02 ENCOUNTER — Ambulatory Visit: Payer: BC Managed Care – PPO | Admitting: Family Medicine

## 2023-04-02 VITALS — BP 134/71 | HR 90 | Ht 75.0 in | Wt 225.0 lb

## 2023-04-02 DIAGNOSIS — Z7984 Long term (current) use of oral hypoglycemic drugs: Secondary | ICD-10-CM | POA: Diagnosis not present

## 2023-04-02 DIAGNOSIS — E119 Type 2 diabetes mellitus without complications: Secondary | ICD-10-CM

## 2023-04-02 DIAGNOSIS — E785 Hyperlipidemia, unspecified: Secondary | ICD-10-CM | POA: Diagnosis not present

## 2023-04-02 DIAGNOSIS — R739 Hyperglycemia, unspecified: Secondary | ICD-10-CM

## 2023-04-02 DIAGNOSIS — E1169 Type 2 diabetes mellitus with other specified complication: Secondary | ICD-10-CM | POA: Diagnosis not present

## 2023-04-02 MED ORDER — METFORMIN HCL 500 MG PO TABS: 1000.0000 mg | ORAL_TABLET | Freq: Two times a day (BID) | ORAL | 3 refills | Status: DC

## 2023-04-02 NOTE — Progress Notes (Signed)
BP 134/71   Pulse 90   Ht 6\' 3"  (1.905 m)   Wt 225 lb (102.1 kg)   SpO2 99%   BMI 28.12 kg/m    Subjective:   Patient ID: Randy Mcmahon, male    DOB: 07/23/1960, 62 y.o.   MRN: [id number]  HPI: Randy Mcmahon is a 62 y.o. male presenting on 04/02/2023 for Medical Management of Chronic Issues, Hyperlipidemia, Hypertension, and Diabetes  Type 2 diabetes mellitus Patient comes in today for recheck of his diabetes. Patient is currently taking Farxiga, metformin, and Rybelsus. Fasting blood sugar averages around 150s at home. Patient quit eating wings as discussed at last visit, but he has now been consuming large portions of grapes. Patient denies any hypoglycemic episodes. Patient has not seen an ophthalmologist this year. Patient denies any new issues with their feet. His diabetes is complicated by hyperlipidemia.  He is trying to reduce carbs as well  Hyperlipidemia Patient is currently taking Crestor. He denies myalgias or weakness. He does not have a history of liver damage from it.   Relevant past medical, surgical, family and social history reviewed and updated as indicated. Interim medical history since our last visit reviewed. Allergies and medications reviewed and updated.  Review of Systems  Constitutional:  Negative for chills, fatigue and fever.  HENT:  Negative for congestion, sinus pressure and sore throat.   Eyes:  Negative for visual disturbance.  Respiratory:  Negative for chest tightness and shortness of breath.   Cardiovascular:  Negative for chest pain, palpitations and leg swelling.  Gastrointestinal:  Negative for abdominal pain, constipation and diarrhea.  Genitourinary:  Positive for frequency. Negative for difficulty urinating and dysuria.       Urinary frequency from [place] and water intake  Musculoskeletal:  Negative for myalgias.  Neurological:  Negative for dizziness, weakness, light-headedness and headaches.    Per HPI unless specifically  indicated above   Allergies as of 04/02/2023   No Known Allergies      Medication List        Accurate as of April 02, 2023 10:23 AM. If you have any questions, ask your nurse or doctor.          cholecalciferol 1000 units tablet Commonly known as: VITAMIN D Take 1,000 Units daily by mouth.   dapagliflozin propanediol 10 MG Tabs tablet Commonly known as: Farxiga Take 1 tablet (10 mg total) by mouth daily before breakfast.   glucose blood test strip Use as instructed   metFORMIN 500 MG tablet Commonly known as: GLUCOPHAGE Take 2 tablets (1,000 mg total) by mouth 2 (two) times daily with a meal. What changed:  how much to take when to take this Changed by: Elige Radon Deatra Mcmahen   ONE TOUCH ULTRA SYSTEM KIT w/Device Kit 1 kit by Does not apply route once.   OneTouch Delica Lancets 33G Misc   rosuvastatin 20 MG tablet Commonly known as: CRESTOR Take 20 mg by mouth at bedtime.   Rybelsus 14 MG Tabs Generic drug: Semaglutide Take 1 tablet (14 mg total) by mouth daily.   VITAMIN B 12 PO Take 1 tablet daily by mouth.        Objective:   BP 134/71   Pulse 90   Ht 6\' 3"  (1.905 m)   Wt 225 lb (102.1 kg)   SpO2 99%   BMI 28.12 kg/m   Wt Readings from Last 3 Encounters:  04/02/23 225 lb (102.1 kg)  12/29/22 219 lb (99.3 kg)  09/03/22 219 lb (99.3 kg)    Physical Exam Vitals and nursing note reviewed.  Constitutional:      General: He is not in acute distress.    Appearance: Normal appearance.  HENT:     Head: Normocephalic and atraumatic.     Right Ear: External ear normal.     Left Ear: External ear normal.  Eyes:     Conjunctiva/sclera: Conjunctivae normal.  Cardiovascular:     Rate and Rhythm: Normal rate and regular rhythm.     Heart sounds: Normal heart sounds.  Pulmonary:     Effort: Pulmonary effort is normal.     Breath sounds: Normal breath sounds. No wheezing.  Abdominal:     General: Abdomen is flat. There is no distension.      Palpations: Abdomen is soft.     Tenderness: There is no abdominal tenderness. There is no right CVA tenderness or left CVA tenderness.  Musculoskeletal:     Cervical back: Normal range of motion and neck supple. No tenderness.     Right lower leg: No edema.     Left lower leg: No edema.  Skin:    General: Skin is warm and dry.     Comments: Scab/healing injury on right shin. No drainage or erythema.  Neurological:     Mental Status: He is alert and oriented to person, place, and time.  Psychiatric:        Mood and Affect: Mood normal.        Behavior: Behavior normal.        Thought Content: Thought content normal.        Judgment: Judgment normal.     Diabetic Foot Exam - Simple   Simple Foot Form Diabetic Foot exam was performed with the following findings: Yes 04/02/2023 10:09 AM  Visual Inspection No deformities, no ulcerations, no other skin breakdown bilaterally: Yes Sensation Testing Intact to touch and monofilament testing bilaterally: Yes Pulse Check Posterior Tibialis and Dorsalis pulse intact bilaterally: Yes Comments     Assessment & Plan:   Problem List Items Addressed This Visit       Endocrine   Type 2 diabetes mellitus not at goal [facility]) - Primary   Relevant Medications   metFORMIN (GLUCOPHAGE) 500 MG tablet   Hyperlipidemia associated with type 2 diabetes mellitus (HCC)   Relevant Medications   metFORMIN (GLUCOPHAGE) 500 MG tablet   Other Visit Diagnoses     Elevated blood sugar       Relevant Medications   metFORMIN (GLUCOPHAGE) 500 MG tablet   Type 2 diabetes mellitus with other specified complication, without long-term current use of insulin (HCC)       Relevant Medications   metFORMIN (GLUCOPHAGE) 500 MG tablet       Patient is doing well with no complaints. HgbA1c 7.8% when checked through work. Encouraged patient to implement diet changes such as smaller portion sizes of grapes and eating foods in moderation. Discussed starting Ozempic  since patient is at the maximum doses for metformin and Rybelsus, but he declined at this time. Will revisit conversation at next visit. 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  Follow up plan: Return in about 3 months (around 07/03/2023), or if symptoms worsen or fail to improve, for Diabetes and hypertension and cholesterol recheck.  Counseling provided for all of the vaccine components No orders of the defined types were  placed in this encounter.   Gillermina Phy, Medical Student [facility] 04/02/2023, 10:23 AM   Patient seen and examined with Gillermina Phy, medical student.  Agree with assessment and plan above.  A1c was up at 7.8.  It is checked in less than a month ago at work.  Patient did not want to switch from Rybelsus to Ozempic at this point and wants to try focusing on diet and see if he can do better. Arville Care, MD [facility] 04/09/2023, 10:22 AM

## 2023-04-14 DIAGNOSIS — H4312 Vitreous hemorrhage, left eye: Secondary | ICD-10-CM | POA: Diagnosis not present

## 2023-04-14 DIAGNOSIS — H2513 Age-related nuclear cataract, bilateral: Secondary | ICD-10-CM | POA: Diagnosis not present

## 2023-04-14 DIAGNOSIS — E119 Type 2 diabetes mellitus without complications: Secondary | ICD-10-CM | POA: Diagnosis not present

## 2023-04-14 DIAGNOSIS — E113512 Type 2 diabetes mellitus with proliferative diabetic retinopathy with macular edema, left eye: Secondary | ICD-10-CM | POA: Diagnosis not present

## 2023-04-14 LAB — HM DIABETES EYE EXAM

## 2023-04-28 DIAGNOSIS — E113513 Type 2 diabetes mellitus with proliferative diabetic retinopathy with macular edema, bilateral: Secondary | ICD-10-CM | POA: Diagnosis not present

## 2023-06-18 NOTE — Telephone Encounter (Signed)
Last OV: 09/04/2022  Last Labs:  Last Refills: 02/13/2023  Next Appt: X  Last EKG: 09/04/2022    Patient needs Lipid profile order placed    TY

## 2023-06-19 MED ORDER — ROSUVASTATIN CALCIUM 20 MG PO TABS
20 MG | ORAL_TABLET | Freq: Every day | ORAL | 3 refills | Status: DC
Start: 2023-06-19 — End: 2023-09-16

## 2023-06-25 ENCOUNTER — Other Ambulatory Visit (HOSPITAL_COMMUNITY): Payer: Self-pay

## 2023-06-25 ENCOUNTER — Telehealth: Payer: Self-pay

## 2023-06-25 NOTE — Telephone Encounter (Signed)
 Pharmacy Patient Advocate Encounter   Received notification from  [facility] Portal that prior authorization for Rybelsus  14 mg tablets is required/requested.   Insurance verification completed.   The patient is insured through [employer] .   Per test claim: PA required; PA submitted to above mentioned insurance via Phone Key/confirmation #/[id number] Status is pending

## 2023-06-29 ENCOUNTER — Other Ambulatory Visit (HOSPITAL_COMMUNITY): Payer: Self-pay

## 2023-06-29 NOTE — Telephone Encounter (Signed)
 The insurance company is requiring additional info. Filled out and faxed to [phone].

## 2023-06-30 ENCOUNTER — Telehealth: Payer: Self-pay | Admitting: Pharmacist

## 2023-06-30 NOTE — Telephone Encounter (Signed)
PA completed and chart notes faxed/attached Insurance may require trial/failure of Trulicity as it is the preferred agent for GLP1s

## 2023-07-01 ENCOUNTER — Other Ambulatory Visit (HOSPITAL_COMMUNITY): Payer: Self-pay

## 2023-07-01 NOTE — Telephone Encounter (Signed)
Pharmacy Patient Advocate Encounter  Received notification from [employer] that Prior Authorization for RYBELSUS 14MG  TABLET has been DENIED.  Full denial letter will be uploaded to the media tab. See denial reason below.    PA #/Case ID/Reference #: [id number]

## 2023-07-06 ENCOUNTER — Encounter: Payer: Self-pay | Admitting: Family Medicine

## 2023-07-06 ENCOUNTER — Ambulatory Visit (INDEPENDENT_AMBULATORY_CARE_PROVIDER_SITE_OTHER): Payer: Commercial Managed Care - HMO | Admitting: Family Medicine

## 2023-07-06 VITALS — BP 147/73 | HR 98 | Ht 75.0 in | Wt 219.0 lb

## 2023-07-06 DIAGNOSIS — Z7985 Long-term (current) use of injectable non-insulin antidiabetic drugs: Secondary | ICD-10-CM

## 2023-07-06 DIAGNOSIS — Z7984 Long term (current) use of oral hypoglycemic drugs: Secondary | ICD-10-CM

## 2023-07-06 DIAGNOSIS — E1169 Type 2 diabetes mellitus with other specified complication: Secondary | ICD-10-CM

## 2023-07-06 DIAGNOSIS — E119 Type 2 diabetes mellitus without complications: Secondary | ICD-10-CM

## 2023-07-06 DIAGNOSIS — E785 Hyperlipidemia, unspecified: Secondary | ICD-10-CM

## 2023-07-06 LAB — CMP14+EGFR
ALT: 32 [IU]/L (ref 0–44)
AST: 14 [IU]/L (ref 0–40)
Albumin: 4.5 g/dL (ref 3.9–4.9)
Alkaline Phosphatase: 125 [IU]/L — ABNORMAL HIGH (ref 44–121)
BUN/Creatinine Ratio: 10 (ref 10–24)
BUN: 8 mg/dL (ref 8–27)
Bilirubin Total: 0.4 mg/dL (ref 0.0–1.2)
CO2: 24 mmol/L (ref 20–29)
Calcium: 9.6 mg/dL (ref 8.6–10.2)
Chloride: 101 mmol/L (ref 96–106)
Creatinine, Ser: 0.77 mg/dL (ref 0.76–1.27)
Globulin, Total: 2.6 g/dL (ref 1.5–4.5)
Glucose: 168 mg/dL — ABNORMAL HIGH (ref 70–99)
Potassium: 4.1 mmol/L (ref 3.5–5.2)
Sodium: 141 mmol/L (ref 134–144)
Total Protein: 7.1 g/dL (ref 6.0–8.5)
eGFR: 100 mL/min/{1.73_m2} (ref >59)

## 2023-07-06 LAB — CBC WITH DIFFERENTIAL/PLATELET
Basophils Absolute: 0 10*3/uL (ref 0.0–0.2)
Basos: 0 %
EOS (ABSOLUTE): 0.1 10*3/uL (ref 0.0–0.4)
Eos: 1 %
Hematocrit: 47.2 % (ref 37.5–51.0)
Hemoglobin: 15.4 g/dL (ref 13.0–17.7)
Immature Grans (Abs): 0 10*3/uL (ref 0.0–0.1)
Immature Granulocytes: 0 %
Lymphocytes Absolute: 1.1 10*3/uL (ref 0.7–3.1)
Lymphs: 20 %
MCH: 29 pg (ref 26.6–33.0)
MCHC: 32.6 g/dL (ref 31.5–35.7)
MCV: 89 fL (ref 79–97)
Monocytes Absolute: 0.5 10*3/uL (ref 0.1–0.9)
Monocytes: 9 %
Neutrophils Absolute: 3.8 10*3/uL (ref 1.4–7.0)
Neutrophils: 70 %
Platelets: 160 10*3/uL (ref 150–450)
RBC: 5.31 x10E6/uL (ref 4.14–5.80)
RDW: 13.3 % (ref 11.6–15.4)
WBC: 5.6 10*3/uL (ref 3.4–10.8)

## 2023-07-06 LAB — LIPID PANEL
Chol/HDL Ratio: 3.5 {ratio} (ref 0.0–5.0)
Cholesterol, Total: 132 mg/dL (ref 100–199)
HDL: 38 mg/dL — ABNORMAL LOW (ref >39)
LDL Chol Calc (NIH): 68 mg/dL (ref 0–99)
Triglycerides: 151 mg/dL — ABNORMAL HIGH (ref 0–149)
VLDL Cholesterol Cal: 26 mg/dL (ref 5–40)

## 2023-07-06 LAB — BAYER DCA HB A1C WAIVED: HB A1C (BAYER DCA - WAIVED): 7.7 % — ABNORMAL HIGH (ref 4.8–5.6)

## 2023-07-06 NOTE — Progress Notes (Signed)
 BP (!) 147/73   Pulse 98   Ht 6\' 3"  (1.905 m)   Wt 219 lb (99.3 kg)   SpO2 99%   BMI 27.37 kg/m    Subjective:   Patient ID: Randy Mcmahon, male    DOB: Jul 15, 1960, 63 y.o.   MRN: [id number]  HPI: Randy Mcmahon is a 63 y.o. male presenting on 07/06/2023 for Medical Management of Chronic Issues and Diabetes   HPI Type 2 diabetes mellitus Patient comes in today for recheck of his diabetes. Patient has been currently taking metformin and Rybelsus and [place]. Patient is not currently on an ACE inhibitor/ARB. Patient has not seen an ophthalmologist this year. Patient denies any new issues with their feet. The symptom started onset as an adult hyperlipidemia ARE RELATED TO DM   Hyperlipidemia Patient is coming in for recheck of his hyperlipidemia. The patient is currently taking diet control, wants to try that, resistant towards medicine. They deny any issues with myalgias or history of liver damage from it. They deny any focal numbness or weakness or chest pain.   Relevant past medical, surgical, family and social history reviewed and updated as indicated. Interim medical history since our last visit reviewed. Allergies and medications reviewed and updated.  Review of Systems  Constitutional:  Negative for chills and fever.  Eyes:  Negative for discharge.  Respiratory:  Negative for shortness of breath and wheezing.   Cardiovascular:  Negative for chest pain and leg swelling.  Musculoskeletal:  Negative for back pain and gait problem.  Skin:  Negative for rash.  Neurological:  Negative for dizziness and light-headedness.  All other systems reviewed and are negative.   Per HPI unless specifically indicated above   Allergies as of 07/06/2023   No Known Allergies      Medication List        Accurate as of July 06, 2023 10:00 AM. If you have any questions, ask your nurse or doctor.          STOP taking these medications    rosuvastatin 20 MG  tablet Commonly known as: CRESTOR Stopped by: Elige Radon Suhana Wilner       TAKE these medications    cholecalciferol 1000 units tablet Commonly known as: VITAMIN D Take 1,000 Units daily by mouth.   dapagliflozin propanediol 10 MG Tabs tablet Commonly known as: Farxiga Take 1 tablet (10 mg total) by mouth daily before breakfast.   glucose blood test strip Use as instructed   metFORMIN 500 MG tablet Commonly known as: GLUCOPHAGE Take 2 tablets (1,000 mg total) by mouth 2 (two) times daily with a meal.   ONE TOUCH ULTRA SYSTEM KIT w/Device Kit 1 kit by Does not apply route once.   OneTouch Delica Lancets 33G Misc   Rybelsus 14 MG Tabs Generic drug: Semaglutide Take 1 tablet (14 mg total) by mouth daily.   VITAMIN B 12 PO Take 1 tablet daily by mouth.         Objective:   BP (!) 147/73   Pulse 98   Ht 6\' 3"  (1.905 m)   Wt 219 lb (99.3 kg)   SpO2 99%   BMI 27.37 kg/m   Wt Readings from Last 3 Encounters:  07/06/23 219 lb (99.3 kg)  04/02/23 225 lb (102.1 kg)  12/29/22 219 lb (99.3 kg)    Physical Exam Vitals and nursing note reviewed.  Constitutional:      General: He is not in acute distress.    Appearance:  He is well-developed. He is not diaphoretic.  Eyes:     General: No scleral icterus.    Conjunctiva/sclera: Conjunctivae normal.  Neck:     Thyroid: No thyromegaly.  Cardiovascular:     Rate and Rhythm: Normal rate and regular rhythm.     Heart sounds: Normal heart sounds. No murmur heard. Pulmonary:     Effort: Pulmonary effort is normal. No respiratory distress.     Breath sounds: Normal breath sounds. No wheezing.  Musculoskeletal:        General: No swelling. Normal range of motion.     Cervical back: Neck supple.  Lymphadenopathy:     Cervical: No cervical adenopathy.  Skin:    General: Skin is warm and dry.     Findings: No rash.  Neurological:     Mental Status: He is alert and oriented to person, place, and time.     Coordination:  Coordination normal.  Psychiatric:        Behavior: Behavior normal.     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  It is about the same.  Continue to focus on diet, has a change of insurance so will not adjust medicines today.  Patient's blood pressure is slightly elevated but has been good in the past, just continue to monitor and have it checked at work. Follow up plan: Return in about 3 months (around 10/03/2023), or if symptoms worsen or fail to improve, for Diabetes and hyperlipidemia.  Counseling provided for all of the vaccine components Orders Placed This Encounter  Procedures   CBC with Differential/Platelet   CMP14+EGFR   Lipid panel   Bayer DCA Hb A1c Waived   Microalbumin / creatinine urine ratio    Arville Care, MD [facility] 07/06/2023, 10:00 AM

## 2023-07-07 LAB — MICROALBUMIN / CREATININE URINE RATIO
Creatinine, Urine: 43.8 mg/dL
Microalb/Creat Ratio: 75 mg/g{creat} — ABNORMAL HIGH (ref 0–29)
Microalbumin, Urine: 32.7 ug/mL

## 2023-09-15 ENCOUNTER — Telehealth

## 2023-09-15 NOTE — Telephone Encounter (Signed)
 Last OV:09/04/2022  Last Labs: Needs labs  Last Refills: 06/19/2023  Next Appt: X  Last EKG: 09/04/2022    Please place order for labs

## 2023-09-16 MED ORDER — ROSUVASTATIN CALCIUM 20 MG PO TABS
20 | ORAL_TABLET | Freq: Every day | ORAL | 0 refills | 90.00000 days | Status: AC
Start: 2023-09-16 — End: ?

## 2023-09-16 NOTE — Telephone Encounter (Signed)
 Pt needs to make follow up visit as he is due this month for his yearly follow up.  Will send 30 days please schedule pt for yearly follow up and advise him of fasting blood work to be completed prior to visit.

## 2023-09-16 NOTE — Telephone Encounter (Signed)
 Lvm for pt regarding to scheduling over due yearly f/u

## 2023-09-17 NOTE — Telephone Encounter (Signed)
 Patient is scheduled 05/29 at 9:15 am.

## 2023-10-05 ENCOUNTER — Ambulatory Visit: Payer: Commercial Managed Care - HMO | Admitting: Family Medicine

## 2023-10-07 ENCOUNTER — Ambulatory Visit (INDEPENDENT_AMBULATORY_CARE_PROVIDER_SITE_OTHER): Admitting: Family Medicine

## 2023-10-07 ENCOUNTER — Encounter: Payer: Self-pay | Admitting: Family Medicine

## 2023-10-07 VITALS — BP 139/72 | HR 88 | Ht 75.0 in | Wt 223.0 lb

## 2023-10-07 DIAGNOSIS — E1169 Type 2 diabetes mellitus with other specified complication: Secondary | ICD-10-CM | POA: Diagnosis not present

## 2023-10-07 DIAGNOSIS — E785 Hyperlipidemia, unspecified: Secondary | ICD-10-CM | POA: Diagnosis not present

## 2023-10-07 DIAGNOSIS — Z7984 Long term (current) use of oral hypoglycemic drugs: Secondary | ICD-10-CM

## 2023-10-07 DIAGNOSIS — E119 Type 2 diabetes mellitus without complications: Secondary | ICD-10-CM

## 2023-10-07 LAB — BAYER DCA HB A1C WAIVED: HB A1C (BAYER DCA - WAIVED): 7.6 % — ABNORMAL HIGH (ref 4.8–5.6)

## 2023-10-07 MED ORDER — DAPAGLIFLOZIN PROPANEDIOL 10 MG PO TABS: 10.0000 mg | ORAL_TABLET | Freq: Every day | ORAL | 3 refills | Status: AC

## 2023-10-07 MED ORDER — TRULICITY 1.5 MG/0.5ML ~~LOC~~ SOAJ
1.5000 mg | SUBCUTANEOUS | 3 refills | Status: DC
Start: 2023-10-07 — End: 2024-01-26

## 2023-10-07 NOTE — Progress Notes (Signed)
 BP 139/72   Pulse 88   Ht 6\' 3"  (1.905 m)   Wt 223 lb (101.2 kg)   SpO2 97%   BMI 27.87 kg/m    Subjective:   Patient ID: Randy Mcmahon, male    DOB: January 01, 1961, 63 y.o.   MRN: [id number]  HPI: Randy Mcmahon is a 63 y.o. male presenting on 10/07/2023 for Medical Management of Chronic Issues and Diabetes   HPI Type 2 diabetes mellitus Patient comes in today for recheck of his diabetes. Patient has been currently taking Rybelsus  and metformin  and Farxiga . Patient is currently on an ACE inhibitor/ARB. Patient has not seen an ophthalmologist this year. Patient denies any new issues with their feet. The symptom started onset as an adult hyperlipidemia ARE RELATED TO DM   Hyperlipidemia Patient is coming in for recheck of his hyperlipidemia. The patient is currently taking Crestor . They deny any issues with myalgias or history of liver damage from it. They deny any focal numbness or weakness or chest pain.   Relevant past medical, surgical, family and social history reviewed and updated as indicated. Interim medical history since our last visit reviewed. Allergies and medications reviewed and updated.  Review of Systems  Constitutional:  Negative for chills and fever.  Eyes:  Negative for visual disturbance.  Respiratory:  Negative for shortness of breath and wheezing.   Cardiovascular:  Negative for chest pain and leg swelling.  Musculoskeletal:  Negative for back pain and gait problem.  Skin:  Negative for rash.  Neurological:  Negative for dizziness and light-headedness.  All other systems reviewed and are negative.   Per HPI unless specifically indicated above   Allergies as of 10/07/2023   No Known Allergies      Medication List        Accurate as of Oct 07, 2023  4:20 PM. If you have any questions, ask your nurse or doctor.          STOP taking these medications    Rybelsus  14 MG Tabs Generic drug: Semaglutide  Stopped by: Lucio Sabin Rahmon Heigl        TAKE these medications    cholecalciferol 1000 units tablet Commonly known as: VITAMIN D Take 1,000 Units daily by mouth.   dapagliflozin  propanediol 10 MG Tabs tablet Commonly known as: Farxiga  Take 1 tablet (10 mg total) by mouth daily before breakfast.   glucose blood test strip Use as instructed   metFORMIN  500 MG tablet Commonly known as: GLUCOPHAGE  Take 2 tablets (1,000 mg total) by mouth 2 (two) times daily with a meal.   ONE TOUCH ULTRA SYSTEM KIT w/Device Kit 1 kit by Does not apply route once.   OneTouch Delica Lancets 33G Misc   rosuvastatin  20 MG tablet Commonly known as: CRESTOR  Take 20 mg by mouth daily.   Trulicity 1.5 MG/0.5ML Soaj Generic drug: Dulaglutide Inject 1.5 mg into the skin once a week. Started by: Lucio Sabin Analea Muller   VITAMIN B 12 PO Take 1 tablet daily by mouth.         Objective:   BP 139/72   Pulse 88   Ht 6\' 3"  (1.905 m)   Wt 223 lb (101.2 kg)   SpO2 97%   BMI 27.87 kg/m   Wt Readings from Last 3 Encounters:  10/07/23 223 lb (101.2 kg)  07/06/23 219 lb (99.3 kg)  04/02/23 225 lb (102.1 kg)    Physical Exam Vitals and nursing note reviewed.  Constitutional:  General: He is not in acute distress.    Appearance: He is well-developed. He is not diaphoretic.  Eyes:     General: No scleral icterus.    Conjunctiva/sclera: Conjunctivae normal.  Neck:     Thyroid : No thyromegaly.  Cardiovascular:     Rate and Rhythm: Normal rate and regular rhythm.     Heart sounds: Normal heart sounds. No murmur heard. Pulmonary:     Effort: Pulmonary effort is normal. No respiratory distress.     Breath sounds: Normal breath sounds. No wheezing.  Musculoskeletal:        General: Normal range of motion.     Cervical back: Neck supple.  Lymphadenopathy:     Cervical: No cervical adenopathy.  Skin:    General: Skin is warm and dry.     Findings: No rash.  Neurological:     Mental Status: He is alert and oriented to person,  place, and time.     Coordination: Coordination normal.  Psychiatric:        Behavior: Behavior normal.       Assessment & Plan:   Problem List Items Addressed This Visit       Endocrine   Type 2 diabetes mellitus not at goal [facility])   Relevant Medications   rosuvastatin  (CRESTOR ) 20 MG tablet   dapagliflozin  propanediol (FARXIGA ) 10 MG TABS tablet   Dulaglutide (TRULICITY) 1.5 MG/0.5ML SOAJ   Hyperlipidemia associated with type 2 diabetes mellitus (HCC)   Relevant Medications   rosuvastatin  (CRESTOR ) 20 MG tablet   dapagliflozin  propanediol (FARXIGA ) 10 MG TABS tablet   Dulaglutide (TRULICITY) 1.5 MG/0.5ML SOAJ   Other Visit Diagnoses       Type 2 diabetes mellitus with other specified complication, without long-term current use of insulin (HCC)    -  Primary   Relevant Medications   rosuvastatin  (CRESTOR ) 20 MG tablet   dapagliflozin  propanediol (FARXIGA ) 10 MG TABS tablet   Dulaglutide (TRULICITY) 1.5 MG/0.5ML SOAJ   Other Relevant Orders   Bayer DCA Hb A1c Waived       Patient says his new insurance does not want to cover the Rybelsus  so we will switch to Trulicity because that is what they say is not going to be covered.  Continue the Farxiga  and the metformin  and the Crestor . Follow up plan: Return in about 3 months (around 01/07/2024), or if symptoms worsen or fail to improve, for Diabetes and hyperlipidemia.  Counseling provided for all of the vaccine components Orders Placed This Encounter  Procedures   Bayer DCA Hb A1c Waived    Jolyne Needs, MD [facility] 10/07/2023, 4:20 PM

## 2023-10-09 ENCOUNTER — Telehealth: Payer: Self-pay

## 2023-10-09 ENCOUNTER — Other Ambulatory Visit (HOSPITAL_COMMUNITY): Payer: Self-pay

## 2023-10-09 NOTE — Telephone Encounter (Signed)
 Pharmacy Patient Advocate Encounter  Received notification from [employer] that Prior Authorization for Trulicity 1.5MG /0.5ML auto-injectors has been APPROVED from 10/09/23 to 10/08/24. Ran test claim, Copay is $25. This test claim was processed through [facility]- copay amounts may vary at other pharmacies due to pharmacy/plan contracts, or as the patient moves through the different stages of their insurance plan.   PA #/Case ID/Reference #: [id number]

## 2023-10-09 NOTE — Telephone Encounter (Signed)
 Pharmacy Patient Advocate Encounter   Received notification from Onbase that prior authorization for Trulicity 1.5MG /0.5ML auto-injectors is required/requested.   Insurance verification completed.   The patient is insured through [employer] .   Per test claim: PA required; PA submitted to above mentioned insurance via CoverMyMeds Key/confirmation #/EOC [facility] Status is pending

## 2023-10-15 ENCOUNTER — Ambulatory Visit
Admit: 2023-10-15 | Discharge: 2023-10-15 | Payer: BLUE CROSS/BLUE SHIELD | Attending: Cardiovascular Disease | Primary: Family Medicine

## 2023-10-15 VITALS — BP 112/74 | HR 74 | Ht 72.99 in | Wt 227.0 lb

## 2023-10-15 DIAGNOSIS — R931 Abnormal findings on diagnostic imaging of heart and coronary circulation: Secondary | ICD-10-CM

## 2023-10-15 MED ORDER — ROSUVASTATIN CALCIUM 20 MG PO TABS
20 | ORAL_TABLET | Freq: Every day | ORAL | 3 refills | 90.00000 days | Status: AC
Start: 2023-10-15 — End: ?

## 2023-10-15 NOTE — Progress Notes (Signed)
 [facility]    David Roman  06-29-1960    Oct 15, 2023    Reason for Consult: Elevated calcium  score     CC: " I have anxiety"    HPI:  The patient is 63 y.o. male with a past medical history significant for OSA who presents for evaluation of an elevated calcium  score of 488. He is accompanied by his wife and states that overall he is feeling well. He reports an occasional "twinge" in his chest but would not describe it as pain. The episodes occur at random and resolve within seconds. The episodes are not associated with exertion and resolve without any particular intervention. He state he was a former smoker. He states that his brother had heart disease and passed away in his 30s. His wife has his labs pulled up on MyChart and his LDL is 121. He reports medication compliance and is tolerating.    Today he presents for follow up and states that overall he is feeling well.  He is staying active.  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  He reports medication compliance and is tolerating. He denies any abnormal bleeding or bruising. He denies exertional chest pain/pressure, dyspnea at rest, worsening DOE, PND, orthopnea, palpitations, lightheadedness, weight changes, changes in LE edema, and syncope.     Review of Systems:  Constitutional: No fatigue, weakness, night sweats or fever.   HEENT: No new vision difficulties or ringing in the ears.  Respiratory: No new SOB, PND, orthopnea or cough.   Cardiovascular: See HPI   GI: No n/v, diarrhea, constipation, abdominal pain or changes in bowel habits.  No melena, no hematochezia  GU: No urinary frequency, urgency, incontinence, hematuria or dysuria.  Skin: No cyanosis or skin lesions.  Musculoskeletal: No new muscle or joint pain.  Neurological: No syncope or TIA-like symptoms.  Psychiatric: No anxiety, insomnia or depression     Past medical history:  No pertinent past medical history other than gout    Family History   Problem Relation  Age of Onset    Hypertension Brother      Social History     Tobacco Use    Smoking status: Never     Passive exposure: Never    Smokeless tobacco: Never   Substance Use Topics    Alcohol use: Never    Drug use: Never       No Known Allergies  Current Outpatient Medications   Medication Sig Dispense Refill    rosuvastatin  (CRESTOR ) 20 MG tablet Take 1 tablet by mouth daily 30 tablet 0    allopurinol (ZYLOPRIM) 300 MG tablet Take 1 tablet by mouth daily Take 1 tablet daily      colchicine (COLCRYS) 0.6 MG tablet Take 1 tablet by mouth daily Take 1 tablet daily      meloxicam (MOBIC) 15 MG tablet Take 1 tablet by mouth daily Take 1 tablet daily      Multiple Vitamins-Minerals (MULTIVITAMIN ADULT EXTRA C PO) Take by mouth Take 1 tablet daily      Ascorbic Acid (VITAMIN C) 250 MG tablet Take 1 tablet by mouth daily Take 1 tablet daily (Patient not taking: Reported on 10/15/2023)      Cholecalciferol (VITAMIN D3) 125 MCG (5000 UT) TABS Take by mouth Take 1 tablet daily (Patient not taking: Reported on 10/15/2023)      zolpidem (AMBIEN) 5 MG tablet Take 1 tablet by mouth nightly as needed for Sleep. Take 1 tablet daily/  prn (Patient not taking: Reported on 10/15/2023)       No current facility-administered medications for this visit.       Physical Exam:   BP 112/74 (BP Site: Left Upper Arm, Patient Position: Sitting, BP Cuff Size: Large Adult)   Pulse 74   Ht 1.854 m (6' 0.99")   Wt 103 kg (227 lb)   SpO2 98%   BMI 29.96 kg/m   No intake or output data in the 24 hours ending 10/15/23 0944  Wt Readings from Last 2 Encounters:   10/15/23 103 kg (227 lb)   09/04/22 105.7 kg (233 lb)     Constitutional: He is oriented to person, place, and time. He appears well-developed and well-nourished. In no acute distress.   Head: Normocephalic and atraumatic.     Neck: Neck supple. No JVD present. Carotid bruit is not present. No mass and no thyromegaly present. No lymphadenopathy present.  Cardiovascular: Normal rate, regular  rhythm, normal heart sounds and intact distal pulses.  Exam reveals no gallop and no friction rub.  No murmur heard.  Pulmonary/Chest: Effort normal and breath sounds normal. No respiratory distress. He has no wheezes, rhonchi or rales.   Abdominal: Soft, non-tender. Bowel sounds and aorta are normal. He exhibits no organomegaly, mass or bruit.   Extremities: No edema, cyanosis, or clubbing. Pulses are 2+ radial/carotid/dorsalis pedis and posterior tibial bilaterally.  Neurological: He is alert and oriented to person, place, and time. He has normal reflexes. No cranial nerve deficit. Coordination normal.   Skin: Skin is warm and dry. There is no rash or diaphoresis.   Psychiatric: He has a normal mood and affect. His speech is normal and behavior is normal.     Personally reviewed and interpreted   EKG Interpretation: 09/04/21 Sinus rhythm   EKG Interpretation: 09/04/22 Sinus Bradycardia -frequent multiform ectopic ventricular beats   EKG Interpretation 10/15/2023: Sinus Bradycardia  RBBB HR 59      NM Stress 09/27/21   There is a small sized, mild intensity, minimally reversible inferior apical   wall defect which is most consistent with diaphragmatic attenuation   artifact.   No ischemic ECG changes with exercise and no reported chest pain.   Study was not gated secondary to frequent PVCs at rest and throughout   exercise.  Stress Protocols   Resting ECG   Normal sinus rhythm with frequent PVCs.   Right bundle branch block.    Assessment/Plan:    1. Elevated calcium  score   - Non obstructive  488  -Denies any angina on today's exam  -Encouraged exercise and heart healthy diet.  Will order repeat CT Calcium  Score    2. OSA  -Using CPAP    3. Hyperlipidemia, mixed  - He will have labs drawn next PCP visit  - Continue statin therapy Crestor  20 mg daily. Tolerating well with no reported myalgias.     4. Former smoker    -Continued education on smoking cessation        I will see him in office for follow up in 1 year.      This note was scribed in the presence of Brookie Cantor, MD by Belma Boxer, RN.    Physician Attestation:  The scribes documentation has been prepared under my direction and personally reviewed by me in its entirety.     I, Dr. Heywood Louder personally performed the services described in this documentation as scribed by my RN in my presence, and I confirm that the  note above accurately reflects all work, treatment, procedures, and medical decision making performed by me.

## 2023-10-22 ENCOUNTER — Inpatient Hospital Stay: Admit: 2023-10-22 | Payer: PRIVATE HEALTH INSURANCE | Attending: Cardiovascular Disease | Primary: Family Medicine

## 2023-10-22 DIAGNOSIS — R931 Abnormal findings on diagnostic imaging of heart and coronary circulation: Secondary | ICD-10-CM

## 2023-10-27 NOTE — Telephone Encounter (Signed)
 Patient called in to go over results.     Callback: [phone]

## 2023-10-30 NOTE — Telephone Encounter (Signed)
 Pt was called with results.  See other encounter

## 2023-11-12 ENCOUNTER — Telehealth: Payer: Self-pay | Admitting: Family Medicine

## 2023-11-12 NOTE — Telephone Encounter (Signed)
 Pt will have HTA in Sept and wants to discuss patient assistance for Dulaglutide  (TRULICITY ) 1.5 MG/0.5ML SOAJ and dapagliflozin  propanediol (FARXIGA ) 10 MG TABS tablet. He was told that these two rx will go up with his HTA plan. He says that Dr Dettinger told him to get in contact with [employer]. I told pt that he may need to wait until his HTA plan is active. Can [facility] help pt now or will have to wait?  Copied from CRM [id number]. Topic: General - Other >> Nov 12, 2023 12:59 PM DeAngela L wrote: Reason for CRM: Patient calling to ask if the office could help him speak with the lady in office that can help him with his new medicare plan to help him get a discount on his medication   Pt num [phone] (H)

## 2023-11-13 ENCOUNTER — Other Ambulatory Visit (HOSPITAL_COMMUNITY): Payer: Self-pay

## 2023-12-15 ENCOUNTER — Other Ambulatory Visit (HOSPITAL_COMMUNITY): Payer: Self-pay

## 2023-12-15 NOTE — Telephone Encounter (Signed)
 Reached out to patient. Says his Part D is to start in September. Patient will reach out as soon as possible so we can move forward with assistance for Trulicity  and Farxiga .

## 2024-01-11 ENCOUNTER — Ambulatory Visit (INDEPENDENT_AMBULATORY_CARE_PROVIDER_SITE_OTHER): Admitting: Family Medicine

## 2024-01-11 ENCOUNTER — Encounter: Payer: Self-pay | Admitting: Family Medicine

## 2024-01-11 VITALS — BP 130/73 | HR 91 | Ht 75.0 in | Wt 224.0 lb

## 2024-01-11 DIAGNOSIS — E1169 Type 2 diabetes mellitus with other specified complication: Secondary | ICD-10-CM

## 2024-01-11 DIAGNOSIS — E785 Hyperlipidemia, unspecified: Secondary | ICD-10-CM | POA: Diagnosis not present

## 2024-01-11 DIAGNOSIS — Z125 Encounter for screening for malignant neoplasm of prostate: Secondary | ICD-10-CM

## 2024-01-11 DIAGNOSIS — Z7984 Long term (current) use of oral hypoglycemic drugs: Secondary | ICD-10-CM | POA: Diagnosis not present

## 2024-01-11 DIAGNOSIS — E119 Type 2 diabetes mellitus without complications: Secondary | ICD-10-CM

## 2024-01-11 LAB — BAYER DCA HB A1C WAIVED: HB A1C (BAYER DCA - WAIVED): 7 % — ABNORMAL HIGH (ref 4.8–5.6)

## 2024-01-11 NOTE — Progress Notes (Signed)
 BP 130/73   Pulse 91   Ht 6' 3 (1.905 m)   Wt 224 lb (101.6 kg)   SpO2 96%   BMI 28.00 kg/m    Subjective:   Patient ID: Randy Mcmahon, male    DOB: [birth year], 63 y.o.   MRN: [id number]  HPI: Randy Mcmahon is a 62 y.o. male presenting on 01/11/2024 for Medical Management of Chronic Issues, Hyperlipidemia, and Diabetes   Discussed the use of AI scribe software for clinical note transcription with the patient, who gave verbal consent to proceed.  History of Present Illness   Randy Mcmahon is a 63 year old male with type two diabetes and hyperlipidemia who presents for a recheck of his diabetes.  His blood sugar levels have been elevated in the mornings, with a reading over 200 mg/dL yesterday morning. He typically wakes up at 3:30 AM, a habit from his working days. He takes Trulicity  1.5 mg weekly on Mondays, Farxiga , and metformin . He took his medications this morning but has not yet taken his lunchtime dose.  He is managing hyperlipidemia with rosuvastatin  (Crestor ) and reports no side effects. He mentioned that he needs a refill for this medication as he has run out.  He discussed his upcoming transition to Medicare next month and the potential changes in medication costs. He currently receives Farxiga  for free but anticipates a cost increase to $47.  He is retired and enjoys his retired life. He mentioned mowing a place in his yard for deer, which led to some skin irritation, but he has not experienced any swelling or significant discomfort.          Relevant past medical, surgical, family and social history reviewed and updated as indicated. Interim medical history since our last visit reviewed. Allergies and medications reviewed and updated.  Review of Systems  Constitutional:  Negative for chills and fever.  Eyes:  Negative for visual disturbance.  Respiratory:  Negative for shortness of breath and wheezing.   Cardiovascular:  Negative for chest pain  and leg swelling.  Musculoskeletal:  Negative for back pain and gait problem.  Skin:  Negative for rash.  Neurological:  Negative for dizziness and light-headedness.  All other systems reviewed and are negative.   Per HPI unless specifically indicated above   Allergies as of 01/11/2024   No Known Allergies      Medication List        Accurate as of January 11, 2024 11:12 AM. If you have any questions, ask your nurse or doctor.          cholecalciferol 1000 units tablet Commonly known as: VITAMIN D Take 1,000 Units daily by mouth.   dapagliflozin  propanediol 10 MG Tabs tablet Commonly known as: Farxiga  Take 1 tablet (10 mg total) by mouth daily before breakfast.   glucose blood test strip Use as instructed   metFORMIN  500 MG tablet Commonly known as: GLUCOPHAGE  Take 2 tablets (1,000 mg total) by mouth 2 (two) times daily with a meal.   ONE TOUCH ULTRA SYSTEM KIT w/Device Kit 1 kit by Does not apply route once.   OneTouch Delica Lancets 33G Misc   rosuvastatin  20 MG tablet Commonly known as: CRESTOR  Take 20 mg by mouth daily.   Trulicity  1.5 MG/0.5ML Soaj Generic drug: Dulaglutide  Inject 1.5 mg into the skin once a week.   VITAMIN B 12 PO Take 1 tablet daily by mouth.         Objective:  BP 130/73   Pulse 91   Ht 6' 3 (1.905 m)   Wt 224 lb (101.6 kg)   SpO2 96%   BMI 28.00 kg/m   Wt Readings from Last 3 Encounters:  01/11/24 224 lb (101.6 kg)  10/07/23 223 lb (101.2 kg)  07/06/23 219 lb (99.3 kg)    Physical Exam Physical Exam   NECK: Thyroid  without masses. CHEST: Lungs clear to auscultation bilaterally. CARDIOVASCULAR: Regular heart rate and rhythm, no murmurs. ABDOMEN: No costovertebral angle tenderness. EXTREMITIES: No edema, pulses intact bilaterally.         Assessment & Plan:   Problem List Items Addressed This Visit       Endocrine   Type 2 diabetes mellitus not at goal [facility])   Hyperlipidemia associated with type 2  diabetes mellitus (HCC) - Primary   Relevant Orders   Bayer DCA Hb A1c Waived   CBC with Differential/Platelet   CMP14+EGFR   Lipid panel   Other Visit Diagnoses       Prostate cancer screening       Relevant Orders   PSA, total and free          Type 2 diabetes mellitus Type 2 diabetes mellitus with recent A1c of 7.0, improved from 7.6. Reports occasional morning hyperglycemia over 200 mg/dL. Managed with Trulicity , Farxiga , and metformin  without side effects. - Continue Trulicity  1.5 mg weekly. - Continue Farxiga  as prescribed. - Continue metformin  as prescribed.  Hyperlipidemia Hyperlipidemia associated with type 2 diabetes mellitus. Managed with rosuvastatin  without side effects. - Refill rosuvastatin  prescription.          Follow up plan: Return in about 3 months (around 04/12/2024), or if symptoms worsen or fail to improve, for Type 2 diabetes and hyperlipidemia.  Counseling provided for all of the vaccine components Orders Placed This Encounter  Procedures   Bayer DCA Hb A1c Waived   CBC with Differential/Platelet   CMP14+EGFR   Lipid panel   PSA, total and free    Fonda Levins, MD [facility] 01/11/2024, 11:12 AM

## 2024-01-12 LAB — CBC WITH DIFFERENTIAL/PLATELET
Basophils Absolute: 0 x10E3/uL (ref 0.0–0.2)
Basos: 1 %
EOS (ABSOLUTE): 0.1 x10E3/uL (ref 0.0–0.4)
Eos: 2 %
Hematocrit: 46.8 % (ref 37.5–51.0)
Hemoglobin: 15.3 g/dL (ref 13.0–17.7)
Immature Grans (Abs): 0 x10E3/uL (ref 0.0–0.1)
Immature Granulocytes: 0 %
Lymphocytes Absolute: 1.2 x10E3/uL (ref 0.7–3.1)
Lymphs: 22 %
MCH: 29.2 pg (ref 26.6–33.0)
MCHC: 32.7 g/dL (ref 31.5–35.7)
MCV: 89 fL (ref 79–97)
Monocytes Absolute: 0.5 x10E3/uL (ref 0.1–0.9)
Monocytes: 10 %
Neutrophils Absolute: 3.7 x10E3/uL (ref 1.4–7.0)
Neutrophils: 65 %
Platelets: 171 x10E3/uL (ref 150–450)
RBC: 5.24 x10E6/uL (ref 4.14–5.80)
RDW: 14.1 % (ref 11.6–15.4)
WBC: 5.6 x10E3/uL (ref 3.4–10.8)

## 2024-01-12 LAB — LIPID PANEL
Chol/HDL Ratio: 4.4 ratio (ref 0.0–5.0)
Cholesterol, Total: 153 mg/dL (ref 100–199)
HDL: 35 mg/dL — ABNORMAL LOW (ref >39)
LDL Chol Calc (NIH): 83 mg/dL (ref 0–99)
Triglycerides: 204 mg/dL — ABNORMAL HIGH (ref 0–149)
VLDL Cholesterol Cal: 35 mg/dL (ref 5–40)

## 2024-01-12 LAB — CMP14+EGFR
ALT: 26 IU/L (ref 0–44)
AST: 19 IU/L (ref 0–40)
Albumin: 4.8 g/dL (ref 3.9–4.9)
Alkaline Phosphatase: 101 IU/L (ref 44–121)
BUN/Creatinine Ratio: 10 (ref 10–24)
BUN: 9 mg/dL (ref 8–27)
Bilirubin Total: 0.5 mg/dL (ref 0.0–1.2)
CO2: 20 mmol/L (ref 20–29)
Calcium: 10.2 mg/dL (ref 8.6–10.2)
Chloride: 101 mmol/L (ref 96–106)
Creatinine, Ser: 0.86 mg/dL (ref 0.76–1.27)
Globulin, Total: 2.5 g/dL (ref 1.5–4.5)
Glucose: 142 mg/dL — ABNORMAL HIGH (ref 70–99)
Potassium: 4 mmol/L (ref 3.5–5.2)
Sodium: 141 mmol/L (ref 134–144)
Total Protein: 7.3 g/dL (ref 6.0–8.5)
eGFR: 97 mL/min/1.73 (ref >59)

## 2024-01-12 LAB — PSA, TOTAL AND FREE
PSA, Free Pct: 60 %
PSA, Free: 0.18 ng/mL
Prostate Specific Ag, Serum: 0.3 ng/mL (ref 0.0–4.0)

## 2024-01-15 ENCOUNTER — Ambulatory Visit: Payer: Self-pay | Admitting: Family Medicine

## 2024-01-22 ENCOUNTER — Other Ambulatory Visit: Payer: Self-pay | Admitting: Family Medicine

## 2024-01-22 NOTE — Telephone Encounter (Signed)
 Copied from CRM [id number]. Topic: Clinical - Medication Refill >> Jan 22, 2024  3:30 PM Emylou G wrote: Medication: rosuvastatin  (CRESTOR ) 20 MG tablet  Has the patient contacted their pharmacy? Yes (Agent: If no, request that the patient contact the pharmacy for the refill. If patient does not wish to contact the pharmacy document the reason why and proceed with request.) (Agent: If yes, when and what did the pharmacy advise?) call us   This is the patient's preferred pharmacy:  [facility] - [place], [place] - [address] [place] [address] Phone: [phone] Fax: [phone]  Is this the correct pharmacy for this prescription? Yes If no, delete pharmacy and type the correct one.   Has the prescription been filled recently? No  Is the patient out of the medication? Yes  Has the patient been seen for an appointment in the last year OR does the patient have an upcoming appointment? Yes  Can we respond through MyChart? No  Agent: Please be advised that Rx refills may take up to 3 business days. We ask that you follow-up with your pharmacy.

## 2024-01-25 MED ORDER — ROSUVASTATIN CALCIUM 20 MG PO TABS: 20.0000 mg | ORAL_TABLET | Freq: Every day | ORAL | 1 refills | Status: AC

## 2024-01-26 ENCOUNTER — Other Ambulatory Visit: Payer: Self-pay | Admitting: Family Medicine

## 2024-01-26 DIAGNOSIS — E119 Type 2 diabetes mellitus without complications: Secondary | ICD-10-CM

## 2024-01-26 DIAGNOSIS — E1169 Type 2 diabetes mellitus with other specified complication: Secondary | ICD-10-CM

## 2024-01-28 ENCOUNTER — Other Ambulatory Visit (HOSPITAL_COMMUNITY): Payer: Self-pay

## 2024-01-28 ENCOUNTER — Telehealth: Payer: Self-pay

## 2024-01-28 NOTE — Telephone Encounter (Signed)
 Patient reached out regarding his new copays on his Farxiga  and Trulicity  medications.   Copays are $47 and patient would like to move forward with assistance.   Patient ok with us  completing the application on his behalf. I will get these completed and sent to [facility] to complete provider/rx portion.   Monthly income around $1299 Household size 1

## 2024-01-29 NOTE — Progress Notes (Signed)
 Pharmacy Medication Assistance Program Note    02/02/2024  Patient ID: KEVANTE LUNT, male   DOB: [birth year], 63 y.o.   MRN: [id number]     01/29/2024  Outreach Medication One  Manufacturer Medication One Retail buyer Drugs Trulicity   Dose of Trulicity  1.5MG   Type of Radiographer, therapeutic Assistance  Date Application Sent to Prescriber 02/02/2024        02/02/2024  Patient ID: Evalene LITTIE Louder, male  DOB: February 06, 1961, 63 y.o.  MRN:  [id number]     01/29/2024  Outreach Medication Two  Manufacturer Medication Two [employer]  [employer] Drugs Farxiga   Dose of Farxiga  10MG   Type of Sport and exercise psychologist  Date Application Sent to Prescriber 02/02/2024     Emailed provider portions to [employer] for [employer].

## 2024-02-04 ENCOUNTER — Telehealth: Payer: Self-pay

## 2024-02-04 NOTE — Telephone Encounter (Signed)
 Copied from CRM [id number]. Topic: Clinical - Prescription Issue >> Feb 04, 2024 12:35 PM Randy Mcmahon wrote: Reason for CRM:   Dulaglutide  (TRULICITY ) 1.5 MG/0.5ML SOAJ  Pt got a letter from his ins and they stated that they will only cover so many because they need additional information from Dr Maryanne. Please advise or call pt. Thank you

## 2024-02-05 ENCOUNTER — Other Ambulatory Visit (HOSPITAL_COMMUNITY): Payer: Self-pay

## 2024-02-05 NOTE — Telephone Encounter (Signed)
 Provider pages for [employer] PAP emailed to [place] 02/02/24.  Will hopefully get enrolled for PAP and not need to fill using his insurance.   Will reach out to patient regarding letter.

## 2024-02-05 NOTE — Telephone Encounter (Signed)
 Spoke with patient.   He has enough Trulicity  for a month (picked up last week).   PA will be needed for future fills. Pt aware we are still working on PAP with [facility] [employer].

## 2024-02-17 ENCOUNTER — Other Ambulatory Visit (HOSPITAL_COMMUNITY): Payer: Self-pay

## 2024-02-17 NOTE — Progress Notes (Signed)
 Pharmacy Medication Assistance Program Note    02/17/2024  Patient ID: Randy Mcmahon, male  DOB: January 09, 1961, 63 y.o.  MRN:  [id number]     01/29/2024  Outreach Medication Two  Manufacturer Medication Two [employer]  [employer] Drugs Farxiga   Dose of Farxiga  10MG   Type of Radiographer, therapeutic Assistance  Date Application Sent to Prescriber 02/02/2024  Method Application Sent to Manufacturer Fax  Date Application Submitted to Manufacturer 02/17/2024     NEW - SUBMITTED

## 2024-02-17 NOTE — Progress Notes (Signed)
 Pharmacy Medication Assistance Program Note    02/17/2024  Patient ID: Randy Mcmahon, male   DOB: 09-17-1960, 63 y.o.   MRN: [id number]     01/29/2024  Outreach Medication One  Manufacturer Medication One Retail buyer Drugs Trulicity   Dose of Trulicity  1.5MG   Type of Radiographer, therapeutic Assistance  Date Application Sent to Prescriber 02/02/2024  Date Application Submitted to Manufacturer 02/17/2024  Method Application Sent to Manufacturer Fax     New - submitted  Realized exception letter was not submitted, may possibly be denied at first.

## 2024-02-22 NOTE — Telephone Encounter (Signed)
 [employer] assistance pending. Emailing exception letter to [employer] for new Trulicity  enrollment.

## 2024-02-22 NOTE — Progress Notes (Signed)
 Pharmacy Medication Assistance Program Note    02/22/2024  Patient ID: Randy Mcmahon, male  DOB: December 03, 1960, 63 y.o.  MRN:  [id number]     01/29/2024  Outreach Medication Two  Manufacturer Medication Two [employer] Farxiga   Dose of Farxiga  10MG   Type of Sport and exercise psychologist  Date Application Sent to Prescriber 02/02/2024  Method Application Sent to Manufacturer Fax  Date Application Submitted to Manufacturer 02/17/2024  Patient Assistance Determination Approved  Approval Start Date 02/19/2024     APPROVED UNTIL 05/18/25

## 2024-03-05 DIAGNOSIS — M79601 Pain in right arm: Secondary | ICD-10-CM | POA: Diagnosis not present

## 2024-03-05 DIAGNOSIS — M25711 Osteophyte, right shoulder: Secondary | ICD-10-CM | POA: Diagnosis not present

## 2024-03-05 DIAGNOSIS — E785 Hyperlipidemia, unspecified: Secondary | ICD-10-CM | POA: Diagnosis not present

## 2024-03-05 DIAGNOSIS — M25411 Effusion, right shoulder: Secondary | ICD-10-CM | POA: Diagnosis not present

## 2024-03-05 DIAGNOSIS — Z87891 Personal history of nicotine dependence: Secondary | ICD-10-CM | POA: Diagnosis not present

## 2024-03-05 DIAGNOSIS — M7551 Bursitis of right shoulder: Secondary | ICD-10-CM | POA: Diagnosis not present

## 2024-03-05 DIAGNOSIS — M25511 Pain in right shoulder: Secondary | ICD-10-CM | POA: Diagnosis not present

## 2024-03-05 DIAGNOSIS — E119 Type 2 diabetes mellitus without complications: Secondary | ICD-10-CM | POA: Diagnosis not present

## 2024-03-05 DIAGNOSIS — M19011 Primary osteoarthritis, right shoulder: Secondary | ICD-10-CM | POA: Diagnosis not present

## 2024-03-18 ENCOUNTER — Other Ambulatory Visit (HOSPITAL_COMMUNITY): Payer: Self-pay

## 2024-03-20 ENCOUNTER — Other Ambulatory Visit: Payer: Self-pay | Admitting: *Deleted

## 2024-03-20 DIAGNOSIS — R739 Hyperglycemia, unspecified: Secondary | ICD-10-CM

## 2024-03-20 DIAGNOSIS — E1169 Type 2 diabetes mellitus with other specified complication: Secondary | ICD-10-CM

## 2024-03-20 DIAGNOSIS — E119 Type 2 diabetes mellitus without complications: Secondary | ICD-10-CM

## 2024-03-24 DIAGNOSIS — M25511 Pain in right shoulder: Secondary | ICD-10-CM | POA: Diagnosis not present

## 2024-03-24 DIAGNOSIS — M25611 Stiffness of right shoulder, not elsewhere classified: Secondary | ICD-10-CM | POA: Diagnosis not present

## 2024-03-24 DIAGNOSIS — G8929 Other chronic pain: Secondary | ICD-10-CM | POA: Diagnosis not present

## 2024-03-29 ENCOUNTER — Other Ambulatory Visit: Payer: Self-pay

## 2024-03-29 ENCOUNTER — Encounter: Payer: Self-pay | Admitting: Physical Therapy

## 2024-03-29 ENCOUNTER — Ambulatory Visit: Attending: Orthopedic Surgery | Admitting: Physical Therapy

## 2024-03-29 DIAGNOSIS — M25511 Pain in right shoulder: Secondary | ICD-10-CM | POA: Insufficient documentation

## 2024-03-29 DIAGNOSIS — M25611 Stiffness of right shoulder, not elsewhere classified: Secondary | ICD-10-CM | POA: Diagnosis not present

## 2024-03-29 NOTE — Therapy (Signed)
 OUTPATIENT PHYSICAL THERAPY SHOULDER EVALUATION   Patient Name: Randy Mcmahon MRN: [id number] DOB:1960-07-15, 63 y.o., male Today's Date: 03/29/2024  END OF SESSION:  PT End of Session - 03/29/24 0904     Visit Number 1    Number of Visits 8    Date for Recertification  04/26/24    PT Start Time 0835    PT Stop Time 0932    PT Time Calculation (min) 57 min    Activity Tolerance Patient tolerated treatment well    Behavior During Therapy [facility] for tasks assessed/performed          Past Medical History:  Diagnosis Date   Type II or unspecified type diabetes mellitus without mention of complication, uncontrolled 12/31/2012   Past Surgical History:  Procedure Laterality Date   APPENDECTOMY  age 37   Patient Active Problem List   Diagnosis Date Noted   Hyperlipidemia associated with type 2 diabetes mellitus (HCC) 10/14/2018   Type 2 diabetes mellitus not at goal [facility]) 09/12/2015   REFERRING PROVIDER: Manus Gobble DO  REFERRING DIAG: Chronic right shoulder pain.    THERAPY DIAG:  Acute pain of right shoulder - Plan: PT plan of care cert/re-cert  Stiffness of right shoulder, not elsewhere classified - Plan: PT plan of care cert/re-cert  Rationale for Evaluation and Treatment: Rehabilitation  ONSET DATE: ~3 weeks ago.    SUBJECTIVE:                                                                                                                                                                                      SUBJECTIVE STATEMENT: The patient presents to the clinic with c/o sudden onset right shoulder pain about three weeks ago that resulted in an ED visit on 03/05/24.  He states that a f/u visit with Dr. Buford and a subsequent injection was very effective and he is reporting a pain-level of a 1/10 today.    PERTINENT HISTORY: DM.  PAIN:  Are you having pain? Yes: NPRS scale: 1/10.   Pain location: Right shoulder. Pain description: Ache.   Aggravating  factors: Not sure. Relieving factors: Injection.  PRECAUTIONS: None  RED FLAGS: None   WEIGHT BEARING RESTRICTIONS: No  FALLS:  Has patient fallen in last 6 months? 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    PLOF: Independent  PATIENT GOALS:Use right shoulder without pain.    NEXT MD VISIT:   OBJECTIVE:  Note: Objective measures were completed at Evaluation unless otherwise noted.  DIAGNOSTIC FINDINGS:  CT scan:  03/05/24:  Moderate acromioclavicular arthrosis. Lateral subacromial enthesophyte. Suspected subacromial/subdeltoid bursitis. Distal attenuation of supraspinatus tendon, suboptimally evaluated on CT. No rotator cuff muscle atrophy. Soft tissues otherwise unremarkable.   PATIENT SURVEYS:  QUICKDASH:  20 points.  (20.5%).     POSTURE: Rounded shoulders.    UPPER EXTREMITY ROM:   Active right shoulder flexion is 130 degrees (left is 140 degrees), behind back to L4, ER is 45 degrees (left is 50 degrees).    UPPER EXTREMITY MMT:  Normal Right shoulder strength.    SHOULDER SPECIAL TESTS: No significant pain increase with impingement testing.   PALPATION:  Minimal pain over right ACJ and middle deltoid region.                                                                                                                               TREATMENT DATE: 03/29/24:   HMP and IFC at 80-150 Hz on 40% scan x 20 minutes to patient's right shoulder f/b PROM x 8 minutes into right shoulder flexion and ER.  Normal modality resposne following removal of modality.    PATIENT EDUCATION: Education details: See below. Person educated: Patient Education method: Medical Illustrator Education comprehension: verbalized understanding  HOME EXERCISE PROGRAM: HOME EXERCISE PROGRAM [[id number]]  PECTORALIS CORNER STRETCH -  Repeat 4  Repetitions, Hold 30 Seconds, Complete 1 Set, Perform 4 Times a Day  Shoulder Flexion Wall Walking Exercise -  Repeat 10 Repetitions, Hold 10 Seconds, Complete 2 Sets, Perform 3 Times a Day  ASSESSMENT:  CLINICAL IMPRESSION: The patient presents to OPPT with right shoulder pain that came on for no apparent reason about three weeks ago.  He states a recent injection was very helpful.  He lacks range of motion and this noted bilaterally.  His right shoulder strength is normal.  He had no pain with impingement testing.  His QUICKDASH score is 20 points (20.5%).  He has minimal pain over right ACJ and middle deltoid region.  Patient will benefit from skilled physical therapy intervention to address pain and deficits.  OBJECTIVE IMPAIRMENTS: decreased activity tolerance, decreased ROM, increased muscle spasms, postural dysfunction, and pain.   ACTIVITY LIMITATIONS: reach over head  PERSONAL FACTORS: 1 comorbidity: DM are also affecting patient's functional outcome.   REHAB POTENTIAL: Good  CLINICAL DECISION MAKING: Stable/uncomplicated  EVALUATION COMPLEXITY: Low   GOALS:  SHORT TERM GOALS: Target date: 04/26/24.  Ind with a HEP. Goal status: INITIAL  2.  Improve ER flexion and ER by 10 degrees.  Goal status: INITIAL  3.  Improve QUICKDASH score by at least 3 points.   Goal status: INITIAL  4.  Perform ADL's with pain not > 2/10. Goal status: INITIAL  PLAN:  PT FREQUENCY/DURATION: 8 visits.  PLANNED INTERVENTIONS: 97110-Therapeutic exercises, 97530- Therapeutic activity, [id number]- Neuromuscular re-education, 97535- Self Care, 02859- Manual therapy, G0283- Electrical stimulation (unattended), 97016- Vasopneumatic device, [id number]- Ultrasound, 79439 (1-2 muscles), 20561 (3+ muscles)- Dry Needling, Patient/Family education, Cryotherapy, and Moist  heat  PLAN FOR NEXT SESSION: UBE, UE Ranger, wall climbs, doorway and corner stretch.  PRE's.  Modalities and STW/M as needed.      Haidar Muse, PT 03/29/2024, 10:31 AM

## 2024-03-30 ENCOUNTER — Telehealth: Payer: Self-pay | Admitting: Pharmacy Technician

## 2024-03-30 ENCOUNTER — Other Ambulatory Visit (HOSPITAL_COMMUNITY): Payer: Self-pay

## 2024-03-30 NOTE — Telephone Encounter (Signed)
 Pharmacy Patient Advocate Encounter   Received notification from Onbase that prior authorization for Trulicity  1.5MG /0.5ML auto-injectors is required/requested.   Insurance verification completed.   The patient is insured through [facility] ADVANTAGE/RX ADVANCE.   Per test claim: PA required; PA started via CoverMyMeds. KEY [id number] . Waiting for clinical questions to populate.

## 2024-03-30 NOTE — Telephone Encounter (Signed)
 Patient aware and verbalizes understanding.

## 2024-03-30 NOTE — Telephone Encounter (Signed)
 Pharmacy Patient Advocate Encounter  Received notification from [facility] ADVANTAGE/RX ADVANCE that Prior Authorization for Trulicity  1.5MG /0.5ML auto-injectors has been APPROVED from 03/29/24 to 03/29/25. Ran test claim, Copay is $117.50 for 3 months. This test claim was processed through [facility]- copay amounts may vary at other pharmacies due to pharmacy/plan contracts, or as the patient moves through the different stages of their insurance plan.   PA #/Case ID/Reference #: [id number]

## 2024-04-01 ENCOUNTER — Other Ambulatory Visit (HOSPITAL_COMMUNITY): Payer: Self-pay

## 2024-04-01 NOTE — Telephone Encounter (Signed)
 Patient went ahead and paid the $117 for a 3 month supply of Trulicity  at the pharmacy. Will f/u in 2026 if assistance is needed.

## 2024-04-05 ENCOUNTER — Encounter: Payer: Self-pay | Admitting: *Deleted

## 2024-04-05 ENCOUNTER — Ambulatory Visit: Admitting: *Deleted

## 2024-04-05 DIAGNOSIS — M25511 Pain in right shoulder: Secondary | ICD-10-CM | POA: Diagnosis not present

## 2024-04-05 DIAGNOSIS — M25611 Stiffness of right shoulder, not elsewhere classified: Secondary | ICD-10-CM

## 2024-04-05 NOTE — Therapy (Signed)
 OUTPATIENT PHYSICAL THERAPY SHOULDER TREATMENT   Patient Name: Randy Mcmahon MRN: [id number] DOB:1961/02/28, 63 y.o., male Today's Date: 04/05/2024  END OF SESSION:  PT End of Session - 04/05/24 0846     Visit Number 2    Number of Visits 8    Date for Recertification  04/26/24    PT Start Time 0846    PT Stop Time 0946    PT Time Calculation (min) 60 min          Past Medical History:  Diagnosis Date   Type II or unspecified type diabetes mellitus without mention of complication, uncontrolled 12/31/2012   Past Surgical History:  Procedure Laterality Date   APPENDECTOMY  age 10   Patient Active Problem List   Diagnosis Date Noted   Hyperlipidemia associated with type 2 diabetes mellitus (HCC) 10/14/2018   Type 2 diabetes mellitus not at goal [facility]) 09/12/2015   REFERRING PROVIDER: Manus Gobble DO  REFERRING DIAG: Chronic right shoulder pain.    THERAPY DIAG:  Acute pain of right shoulder  Stiffness of right shoulder, not elsewhere classified  Rationale for Evaluation and Treatment: Rehabilitation  ONSET DATE: ~3 weeks ago.    SUBJECTIVE:                                                                                                                                                                                      SUBJECTIVE STATEMENT: RT Shoulder feels good today   PERTINENT HISTORY: DM.  PAIN:  Are you having pain? Yes: NPRS scale: 1/10.   Pain location: Right shoulder. Pain description: Ache.   Aggravating factors: Not sure. Relieving factors: Injection.  PRECAUTIONS: None  RED FLAGS: None   WEIGHT BEARING RESTRICTIONS: No  FALLS:  Has patient fallen in last 6 months? 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    PLOF: Independent  PATIENT GOALS:Use right shoulder without pain.    NEXT MD VISIT:    OBJECTIVE:  Note: Objective measures were completed at Evaluation unless otherwise noted.  DIAGNOSTIC FINDINGS:  CT scan:  03/05/24:  Moderate acromioclavicular arthrosis. Lateral subacromial enthesophyte. Suspected subacromial/subdeltoid bursitis. Distal attenuation of supraspinatus tendon, suboptimally evaluated on CT. No rotator cuff muscle atrophy. Soft tissues otherwise unremarkable.   PATIENT SURVEYS:  QUICKDASH:  20 points.  (20.5%).     POSTURE: Rounded shoulders.    UPPER EXTREMITY ROM:   Active right shoulder flexion is 130 degrees (left is 140 degrees), behind back to L4, ER is 45 degrees (left is 50 degrees).    UPPER EXTREMITY  MMT:  Normal Right shoulder strength.    SHOULDER SPECIAL TESTS: No significant pain increase with impingement testing.   PALPATION:  Minimal pain over right ACJ and middle deltoid region.                                                                                                                               TREATMENT DATE:  04/05/24:   UBE x 6 mins Pulleys x 5 mins Supine cane press up 3x10,  flexion  3x10  Reviewed HEP  PROM x 15 minutes into right shoulder flexion/ ABD and ER/IR supine   Normal modality resposne following removal of modality. HMP and IFC at 80-150 Hz on 40% scan x 15 minutes to patient's right shoulder supine   PATIENT EDUCATION: Education details: See below. Person educated: Patient Education method: Medical Illustrator Education comprehension: verbalized understanding  HOME EXERCISE PROGRAM: HOME EXERCISE PROGRAM [ZXNQUB5]  PECTORALIS CORNER STRETCH -  Repeat 4 Repetitions, Hold 30 Seconds, Complete 1 Set, Perform 4 Times a Day  Shoulder Flexion Wall Walking Exercise -  Repeat 10 Repetitions, Hold 10 Seconds, Complete 2 Sets, Perform 3 Times a Day  ASSESSMENT:  CLINICAL IMPRESSION: The patient presents to OPPT with right shoulder pain that came on for no apparent reason about three  weeks ago. Rx focused on progressing ROM all motions with  AAROM and manual PROM with ER and abd being the most limited. IFC and HMP   OBJECTIVE IMPAIRMENTS: decreased activity tolerance, decreased ROM, increased muscle spasms, postural dysfunction, and pain.   ACTIVITY LIMITATIONS: reach over head  PERSONAL FACTORS: 1 comorbidity: DM are also affecting patient's functional outcome.   REHAB POTENTIAL: Good  CLINICAL DECISION MAKING: Stable/uncomplicated  EVALUATION COMPLEXITY: Low   GOALS:  SHORT TERM GOALS: Target date: 04/26/24.  Ind with a HEP. Goal status: INITIAL  2.  Improve ER flexion and ER by 10 degrees.  Goal status: INITIAL  3.  Improve QUICKDASH score by at least 3 points.   Goal status: INITIAL  4.  Perform ADL's with pain not > 2/10. Goal status: INITIAL  PLAN:  PT FREQUENCY/DURATION: 8 visits.  PLANNED INTERVENTIONS: 97110-Therapeutic exercises, 97530- Therapeutic activity, [id number]- Neuromuscular re-education, 97535- Self Care, 02859- Manual therapy, G0283- Electrical stimulation (unattended), 97016- Vasopneumatic device, [id number]- Ultrasound, 79439 (1-2 muscles), 20561 (3+ muscles)- Dry Needling, Patient/Family education, Cryotherapy, and Moist heat  PLAN FOR NEXT SESSION: UBE, UE Ranger, wall climbs, doorway and corner stretch.  PRE's.  Modalities and STW/M as needed.     Ireland Virrueta,CHRIS, PTA 04/05/2024, 11:42 AM

## 2024-04-12 ENCOUNTER — Ambulatory Visit: Admitting: Physical Therapy

## 2024-04-12 DIAGNOSIS — M25511 Pain in right shoulder: Secondary | ICD-10-CM

## 2024-04-12 DIAGNOSIS — M25611 Stiffness of right shoulder, not elsewhere classified: Secondary | ICD-10-CM

## 2024-04-12 NOTE — Therapy (Signed)
 OUTPATIENT PHYSICAL THERAPY SHOULDER TREATMENT   Patient Name: Randy Mcmahon MRN: [id number] DOB:Sep 29, 1960, 63 y.o., male Today's Date: 04/12/2024  END OF SESSION:  PT End of Session - 04/12/24 1401     Visit Number 3    Number of Visits 8    Date for Recertification  04/26/24    PT Start Time 0146    PT Stop Time 0255    PT Time Calculation (min) 69 min    Activity Tolerance Patient tolerated treatment well    Behavior During Therapy [facility] for tasks assessed/performed           Past Medical History:  Diagnosis Date   Type II or unspecified type diabetes mellitus without mention of complication, uncontrolled 12/31/2012   Past Surgical History:  Procedure Laterality Date   APPENDECTOMY  age 86   Patient Active Problem List   Diagnosis Date Noted   Hyperlipidemia associated with type 2 diabetes mellitus (HCC) 10/14/2018   Type 2 diabetes mellitus not at goal [facility]) 09/12/2015   REFERRING PROVIDER: Manus Gobble DO  REFERRING DIAG: Chronic right shoulder pain.    THERAPY DIAG:  Acute pain of right shoulder  Stiffness of right shoulder, not elsewhere classified  Rationale for Evaluation and Treatment: Rehabilitation  ONSET DATE: ~3 weeks ago.    SUBJECTIVE:                                                                                                                                                                                      SUBJECTIVE STATEMENT:   RT Shoulder feels better.   PERTINENT HISTORY: DM.  PAIN:  Are you having pain? Yes: NPRS scale: 1/10.   Pain location: Right shoulder. Pain description: Ache.   Aggravating factors: Not sure. Relieving factors: Injection.  PRECAUTIONS: None  RED FLAGS: None   WEIGHT BEARING RESTRICTIONS: No  FALLS:  Has patient fallen in last 6 months? No  LIVING ENVIRONMENT: Lives in: House/apartment Has following equipment at home: None  OCCUPATION: Retired from [employer] work in which he performed  repetitive UE motions.    PLOF: Independent  PATIENT GOALS:Use right shoulder without pain.    NEXT MD VISIT:   OBJECTIVE:  Note: Objective measures were completed at Evaluation unless otherwise noted.  DIAGNOSTIC FINDINGS:  CT scan:  03/05/24:  Moderate acromioclavicular arthrosis. Lateral subacromial enthesophyte. Suspected subacromial/subdeltoid bursitis. Distal attenuation of supraspinatus tendon, suboptimally evaluated on CT. No rotator cuff muscle atrophy. Soft tissues otherwise unremarkable.   PATIENT SURVEYS:  QUICKDASH:  20 points.  (20.5%).     POSTURE: Rounded shoulders.    UPPER EXTREMITY ROM:   Active right shoulder flexion is 130 degrees (  left is 140 degrees), behind back to L4, ER is 45 degrees (left is 50 degrees).    UPPER EXTREMITY MMT:  Normal Right shoulder strength.    SHOULDER SPECIAL TESTS: No significant pain increase with impingement testing.   PALPATION:  Minimal pain over right ACJ and middle deltoid region.                                                                                                                               TREATMENT DATE:   04/12/24:                                     EXERCISE LOG  Exercise Repetitions and Resistance Comments  UBE 8 minutes.   Pulleys 5 minutes   UE Ranger on wall 5 minutes   RW4 To fatigue with red theraband        1-1 stretching x 15 minutes into right shoulder flexion and ER f/b HMP and IFC at 80-150 Hz on 40% scan x 20 minutes.  Normal modality resposne following removal of modality.  04/05/24:   UBE x 6 mins Pulleys x 5 mins Supine cane press up 3x10,  flexion  3x10  Reviewed HEP  PROM x 15 minutes into right shoulder flexion/ ABD and ER/IR supine   Normal modality resposne following removal of modality. HMP and IFC at 80-150 Hz on 40% scan x 15 minutes to patient's right shoulder supine   PATIENT EDUCATION: Education details: See below. Person educated: Patient Education  method: Medical Illustrator Education comprehension: verbalized understanding  HOME EXERCISE PROGRAM: HOME EXERCISE PROGRAM [ZXNQUB5]  PECTORALIS CORNER STRETCH -  Repeat 4 Repetitions, Hold 30 Seconds, Complete 1 Set, Perform 4 Times a Day  Shoulder Flexion Wall Walking Exercise -  Repeat 10 Repetitions, Hold 10 Seconds, Complete 2 Sets, Perform 3 Times a Day  ASSESSMENT:  CLINICAL IMPRESSION: Patient did very well with treatment today.  Added RW 4.  Provided him with theraband and pictures of exercises.  Also, provided him with information on obtaining an over the pulley system.  He is pleased with his progress.    OBJECTIVE IMPAIRMENTS: decreased activity tolerance, decreased ROM, increased muscle spasms, postural dysfunction, and pain.   ACTIVITY LIMITATIONS: reach over head  PERSONAL FACTORS: 1 comorbidity: DM are also affecting patient's functional outcome.   REHAB POTENTIAL: Good  CLINICAL DECISION MAKING: Stable/uncomplicated  EVALUATION COMPLEXITY: Low   GOALS:  SHORT TERM GOALS: Target date: 04/26/24.  Ind with a HEP. Goal status: INITIAL  2.  Improve ER flexion and ER by 10 degrees.  Goal status: INITIAL  3.  Improve QUICKDASH score by at least 3 points.   Goal status: INITIAL  4.  Perform ADL's with pain not > 2/10. Goal status: INITIAL  PLAN:  PT FREQUENCY/DURATION: 8 visits.  PLANNED INTERVENTIONS: 97110-Therapeutic exercises, 97530- Therapeutic activity, [id number]- Neuromuscular  re-education, [phone]- Self Care, 02859- Manual therapy, G0283- Electrical stimulation (unattended), 97016- Vasopneumatic device, [id number]- Ultrasound, 79439 (1-2 muscles), 20561 (3+ muscles)- Dry Needling, Patient/Family education, Cryotherapy, and Moist heat  PLAN FOR NEXT SESSION: UBE, UE Ranger, wall climbs, doorway and corner stretch.  PRE's.  Modalities and STW/M as needed.     Artrell Lawless, PT 04/12/2024, 3:08 PM

## 2024-04-19 ENCOUNTER — Ambulatory Visit: Admitting: Physical Therapy

## 2024-04-19 DIAGNOSIS — M25511 Pain in right shoulder: Secondary | ICD-10-CM | POA: Diagnosis present

## 2024-04-19 DIAGNOSIS — M25611 Stiffness of right shoulder, not elsewhere classified: Secondary | ICD-10-CM | POA: Insufficient documentation

## 2024-04-19 NOTE — Therapy (Signed)
 OUTPATIENT PHYSICAL THERAPY SHOULDER TREATMENT   Patient Name: Randy Mcmahon MRN: [id number] DOB:[birth year], 63 y.o., male Today's Date: 04/19/2024  END OF SESSION:  PT End of Session - 04/19/24 0917     Visit Number 4    Number of Visits 8    Date for Recertification  04/26/24    PT Start Time 0907    PT Stop Time 1008    PT Time Calculation (min) 61 min    Activity Tolerance Patient tolerated treatment well    Behavior During Therapy [facility] for tasks assessed/performed           Past Medical History:  Diagnosis Date   Type II or unspecified type diabetes mellitus without mention of complication, uncontrolled 12/31/2012   Past Surgical History:  Procedure Laterality Date   APPENDECTOMY  age 76   Patient Active Problem List   Diagnosis Date Noted   Hyperlipidemia associated with type 2 diabetes mellitus (HCC) 10/14/2018   Type 2 diabetes mellitus not at goal [facility]) 09/12/2015   REFERRING PROVIDER: Manus Gobble DO  REFERRING DIAG: Chronic right shoulder pain.    THERAPY DIAG:  Acute pain of right shoulder  Stiffness of right shoulder, not elsewhere classified  Rationale for Evaluation and Treatment: Rehabilitation  ONSET DATE: ~3 weeks ago.    SUBJECTIVE:                                                                                                                                                                                      SUBJECTIVE STATEMENT:  Doing good.   PERTINENT HISTORY: DM.  PAIN:  Are you having pain? Yes: NPRS scale: 1/10.   Pain location: Right shoulder. Pain description: Ache.   Aggravating factors: Not sure. Relieving factors: Injection.  PRECAUTIONS: None  RED FLAGS: None   WEIGHT BEARING RESTRICTIONS: No  FALLS:  Has patient fallen in last 6 months? 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    PLOF: Independent  PATIENT GOALS:Use right shoulder without pain.    NEXT MD VISIT:   OBJECTIVE:  Note: Objective measures were completed at Evaluation unless otherwise noted.  DIAGNOSTIC FINDINGS:  CT scan:  03/05/24:  Moderate acromioclavicular arthrosis. Lateral subacromial enthesophyte. Suspected subacromial/subdeltoid bursitis. Distal attenuation of supraspinatus tendon, suboptimally evaluated on CT. No rotator cuff muscle atrophy. Soft tissues otherwise unremarkable.   PATIENT SURVEYS:  QUICKDASH:  20 points.  (20.5%).     POSTURE: Rounded shoulders.    UPPER EXTREMITY ROM:   Active right shoulder flexion is 130 degrees (left is 140  degrees), behind back to L4, ER is 45 degrees (left is 50 degrees).    UPPER EXTREMITY MMT:  Normal Right shoulder strength.    SHOULDER SPECIAL TESTS: No significant pain increase with impingement testing.   PALPATION:  Minimal pain over right ACJ and middle deltoid region.                                                                                                                               TREATMENT DATE:   04/19/24:                                     EXERCISE LOG  Exercise Repetitions and Resistance Comments  UBE 10 minutes   Pulleys 5 minutes    UE Ranger on wall 5 minutes            1-1 stretching x 15 minutes into right shoulder flexion and ER f/b HMP and IFC at 80-150 Hz on 40% scan x 20 minutes.  Normal modality resposne following removal of modality.  04/12/24:                                     EXERCISE LOG  Exercise Repetitions and Resistance Comments  UBE 8 minutes.   Pulleys 5 minutes   UE Ranger on wall 5 minutes   RW4 To fatigue with red theraband        1-1 stretching x 15 minutes into right shoulder flexion and ER f/b HMP and IFC at 80-150 Hz on 40% scan x 20 minutes.  Normal modality resposne following removal of modality.  04/05/24:   UBE x 6 mins Pulleys x 5 mins Supine cane  press up 3x10,  flexion  3x10  Reviewed HEP  PROM x 15 minutes into right shoulder flexion/ ABD and ER/IR supine   Normal modality resposne following removal of modality. HMP and IFC at 80-150 Hz on 40% scan x 15 minutes to patient's right shoulder supine   PATIENT EDUCATION: Education details: See below. Person educated: Patient Education method: Medical Illustrator Education comprehension: verbalized understanding  HOME EXERCISE PROGRAM: HOME EXERCISE PROGRAM [[id number]]  PECTORALIS CORNER STRETCH -  Repeat 4 Repetitions, Hold 30 Seconds, Complete 1 Set, Perform 4 Times a Day  Shoulder Flexion Wall Walking Exercise -  Repeat 10 Repetitions, Hold 10 Seconds, Complete 2 Sets, Perform 3 Times a Day  ASSESSMENT:  CLINICAL IMPRESSION: Patient did very well with treatment today.  He states he is compliant to his HEP.  He is progressing toward his goals.   OBJECTIVE IMPAIRMENTS: decreased activity tolerance, decreased ROM, increased muscle spasms, postural dysfunction, and pain.   ACTIVITY LIMITATIONS: reach over head  PERSONAL FACTORS: 1 comorbidity: DM are also affecting patient's functional outcome.   REHAB POTENTIAL: Good  CLINICAL  DECISION MAKING: Stable/uncomplicated  EVALUATION COMPLEXITY: Low   GOALS:  SHORT TERM GOALS: Target date: 04/26/24.  Ind with a HEP. Goal status: INITIAL  2.  Improve ER flexion and ER by 10 degrees.  Goal status: INITIAL  3.  Improve QUICKDASH score by at least 3 points.   Goal status: INITIAL  4.  Perform ADL's with pain not > 2/10. Goal status: INITIAL  PLAN:  PT FREQUENCY/DURATION: 8 visits.  PLANNED INTERVENTIONS: 97110-Therapeutic exercises, 97530- Therapeutic activity, [id number]- Neuromuscular re-education, 97535- Self Care, 02859- Manual therapy, G0283- Electrical stimulation (unattended), 97016- Vasopneumatic device, [id number]- Ultrasound, 79439 (1-2 muscles), 20561 (3+ muscles)- Dry Needling, Patient/Family education,  Cryotherapy, and Moist heat  PLAN FOR NEXT SESSION: UBE, UE Ranger, wall climbs, doorway and corner stretch.  PRE's.  Modalities and STW/M as needed.     Maryagnes Carrasco, PT 04/19/2024, 10:58 AM

## 2024-04-20 ENCOUNTER — Ambulatory Visit: Payer: Self-pay | Admitting: Family Medicine

## 2024-04-20 ENCOUNTER — Encounter: Payer: Self-pay | Admitting: Family Medicine

## 2024-04-20 VITALS — BP 144/81 | HR 114 | Ht 75.0 in | Wt 220.0 lb

## 2024-04-20 DIAGNOSIS — E785 Hyperlipidemia, unspecified: Secondary | ICD-10-CM | POA: Diagnosis not present

## 2024-04-20 DIAGNOSIS — Z7985 Long-term (current) use of injectable non-insulin antidiabetic drugs: Secondary | ICD-10-CM | POA: Diagnosis not present

## 2024-04-20 DIAGNOSIS — E119 Type 2 diabetes mellitus without complications: Secondary | ICD-10-CM

## 2024-04-20 DIAGNOSIS — E1169 Type 2 diabetes mellitus with other specified complication: Secondary | ICD-10-CM

## 2024-04-20 DIAGNOSIS — M25511 Pain in right shoulder: Secondary | ICD-10-CM | POA: Diagnosis not present

## 2024-04-20 DIAGNOSIS — Z7984 Long term (current) use of oral hypoglycemic drugs: Secondary | ICD-10-CM | POA: Diagnosis not present

## 2024-04-20 LAB — VITAMIN B12: Vitamin B-12: 369 pg/mL (ref 232–1245)

## 2024-04-20 LAB — BAYER DCA HB A1C WAIVED: HB A1C (BAYER DCA - WAIVED): 6.1 % — ABNORMAL HIGH (ref 4.8–5.6)

## 2024-04-20 NOTE — Progress Notes (Signed)
 BP (!) 144/81   Pulse (!) 114   Ht 6' 3 (1.905 m)   Wt 220 lb (99.8 kg)   SpO2 98%   BMI 27.50 kg/m    Subjective:   Patient ID: Randy Mcmahon, male    DOB: August 18, 1960, 63 y.o.   MRN: [id number]  HPI: Randy Mcmahon is a 63 y.o. male presenting on 04/20/2024 for Medical Management of Chronic Issues, Diabetes, and Hyperlipidemia   Discussed the use of AI scribe software for clinical note transcription with the patient, who gave verbal consent to proceed.  History of Present Illness   Randy Mcmahon is a 63 year old male who presents with shoulder pain.  Shoulder pain and dysfunction - Sudden onset of severe right shoulder pain seven weeks ago, resulting in inability to move the shoulder and a sleepless night - Received morphine  injection and steroids in the emergency room without pain relief - CT scan performed; bursitis suspected - Prescribed Percocet (took two doses) and naproxen (completed two-week course, found effective for inflammation) - Orthopedic evaluation suspected arthritis in addition to bursitis - Received cortisone injection with subsequent pain relief - Currently undergoing physical therapy - Recent overexertion at home led to pain in the contralateral (left) shoulder, attributed to lifting a golf club - Took three doses of Aleve (naproxen) yesterday, resulting in some pain relief but persistent shoulder stiffness - History of frequent rotation in bed due to discomfort, possibly contributing to shoulder symptoms  Polyarticular joint pain and swelling - Onset of bilateral wrist pain two weeks ago, with difficulty forming a fist - Swelling present in fingers - Pain radiates halfway up the arm - Pain described as 'jumping around' between joints - Symptoms exacerbated by weather changes  Diabetes mellitus management - Diabetes managed with Trulicity , Farxiga , and metformin  - Has not recently checked blood glucose levels due to arm pain           Relevant past medical, surgical, family and social history reviewed and updated as indicated. Interim medical history since our last visit reviewed. Allergies and medications reviewed and updated.  Review of Systems  Constitutional:  Negative for chills and fever.  Eyes:  Negative for visual disturbance.  Respiratory:  Negative for shortness of breath and wheezing.   Cardiovascular:  Negative for chest pain and leg swelling.  Musculoskeletal:  Positive for arthralgias and myalgias. Negative for back pain and gait problem.  Skin:  Negative for rash.  All other systems reviewed and are negative.   Per HPI unless specifically indicated above   Allergies as of 04/20/2024   No Known Allergies      Medication List        Accurate as of April 20, 2024 11:28 AM. If you have any questions, ask your nurse or doctor.          cholecalciferol 1000 units tablet Commonly known as: VITAMIN D Take 1,000 Units daily by mouth.   dapagliflozin  propanediol 10 MG Tabs tablet Commonly known as: Farxiga  Take 1 tablet (10 mg total) by mouth daily before breakfast.   glucose blood test strip Use as instructed   metFORMIN  500 MG tablet Commonly known as: GLUCOPHAGE  TAKE 2 TABLETS BY MOUTH TWICE DAILY WITH MEALS   ONE TOUCH ULTRA SYSTEM KIT w/Device Kit 1 kit by Does not apply route once.   OneTouch Delica Lancets 33G Misc   rosuvastatin  20 MG tablet Commonly known as: CRESTOR  Take 1 tablet (20 mg total) by mouth daily.  Trulicity  1.5 MG/0.5ML Soaj Generic drug: Dulaglutide  INJECT 1.5 MG SUBCUTANEOUSLY ONCE A WEEK   VITAMIN B 12 PO Take 1 tablet daily by mouth.         Objective:   BP (!) 144/81   Pulse (!) 114   Ht 6' 3 (1.905 m)   Wt 220 lb (99.8 kg)   SpO2 98%   BMI 27.50 kg/m   Wt Readings from Last 3 Encounters:  04/20/24 220 lb (99.8 kg)  01/11/24 224 lb (101.6 kg)  10/07/23 223 lb (101.2 kg)    Physical Exam Physical Exam   VITALS: BP-  144/ NECK: Thyroid  without nodules. CHEST: Lungs clear to auscultation bilaterally. CARDIOVASCULAR: Heart regular rate and rhythm, no murmurs. EXTREMITIES: Extremities with good pulses, no sores.         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 Recent flare-up managed with morphine , steroid, naproxen, and cortisone. Persistent stiffness and pain likely from overuse and arthritis. - Continue Aleve (naproxen) 220 mg, 2-3 tablets daily for 5-6 days, then taper. - Encouraged hydration to protect kidney function while on NSAIDs. - Continue physical therapy to maintain shoulder mobility.  Type 2 diabetes mellitus Managed with Trulicity , Farxiga , and metformin . Awaiting A1c results for glycemic control assessment. - Continue Trulicity , Farxiga , and metformin . - Review A1c results to evaluate diabetes management.  Essential hypertension Blood pressure elevated at 144 mmHg, possibly due to shoulder pain. No home monitoring available. - Monitor blood pressure at pharmacies or healthcare facilities.      A1c looks good at 6.1.    Follow up plan: Return in about 3 months (around 07/19/2024), or if symptoms worsen or fail to improve, for Diabetes recheck.  Counseling provided for all of the vaccine components Orders Placed This Encounter  Procedures   Bayer DCA Hb A1c Waived   Vitamin B12    Fonda Levins, MD [facility] 04/20/2024, 11:28 AM

## 2024-04-24 ENCOUNTER — Other Ambulatory Visit: Payer: Self-pay | Admitting: Family Medicine

## 2024-04-24 DIAGNOSIS — E119 Type 2 diabetes mellitus without complications: Secondary | ICD-10-CM

## 2024-04-24 DIAGNOSIS — E1169 Type 2 diabetes mellitus with other specified complication: Secondary | ICD-10-CM

## 2024-04-25 ENCOUNTER — Ambulatory Visit: Payer: Self-pay | Admitting: Family Medicine

## 2024-04-26 ENCOUNTER — Encounter: Payer: Self-pay | Admitting: *Deleted

## 2024-04-26 ENCOUNTER — Ambulatory Visit: Admitting: *Deleted

## 2024-04-26 DIAGNOSIS — M25611 Stiffness of right shoulder, not elsewhere classified: Secondary | ICD-10-CM

## 2024-04-26 DIAGNOSIS — M25511 Pain in right shoulder: Secondary | ICD-10-CM | POA: Diagnosis not present

## 2024-04-26 NOTE — Therapy (Signed)
 OUTPATIENT PHYSICAL THERAPY SHOULDER TREATMENT   Patient Name: Randy Mcmahon MRN: [id number] DOB:05/14/1961, 63 y.o., male Today's Date: 04/26/2024  END OF SESSION:  PT End of Session - 04/26/24 0919     Visit Number 5    Number of Visits 8    Date for Recertification  04/26/24    PT Start Time 0915    PT Stop Time 1021    PT Time Calculation (min) 66 min           Past Medical History:  Diagnosis Date   Type II or unspecified type diabetes mellitus without mention of complication, uncontrolled 12/31/2012   Past Surgical History:  Procedure Laterality Date   APPENDECTOMY  age 57   Patient Active Problem List   Diagnosis Date Noted   Hyperlipidemia associated with type 2 diabetes mellitus (HCC) 10/14/2018   Type 2 diabetes mellitus not at goal [facility]) 09/12/2015   REFERRING PROVIDER: Manus Gobble DO  REFERRING DIAG: Chronic right shoulder pain.    THERAPY DIAG:  Acute pain of right shoulder  Stiffness of right shoulder, not elsewhere classified  Rationale for Evaluation and Treatment: Rehabilitation  ONSET DATE: ~3 weeks ago.    SUBJECTIVE:                                                                                                                                                                                      SUBJECTIVE STATEMENT:  Doing better with less shldr pain    PERTINENT HISTORY: DM.  PAIN:  Are you having pain? Yes: NPRS scale: 1/10.   Pain location: Right shoulder. Pain description: Ache.   Aggravating factors: Not sure. Relieving factors: Injection.  PRECAUTIONS: None  RED FLAGS: None   WEIGHT BEARING RESTRICTIONS: No  FALLS:  Has patient fallen in last 6 months? 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    PLOF: Independent  PATIENT GOALS:Use right shoulder without pain.    NEXT MD  VISIT:   OBJECTIVE:  Note: Objective measures were completed at Evaluation unless otherwise noted.  DIAGNOSTIC FINDINGS:  CT scan:  03/05/24:  Moderate acromioclavicular arthrosis. Lateral subacromial enthesophyte. Suspected subacromial/subdeltoid bursitis. Distal attenuation of supraspinatus tendon, suboptimally evaluated on CT. No rotator cuff muscle atrophy. Soft tissues otherwise unremarkable.   PATIENT SURVEYS:  QUICKDASH:  20 points.  (20.5%).     POSTURE: Rounded shoulders.    UPPER EXTREMITY ROM:   Active right shoulder flexion is 130 degrees (left is 140 degrees), behind back to L4, ER is 45 degrees (left is 50 degrees).  UPPER EXTREMITY MMT:  Normal Right shoulder strength.    SHOULDER SPECIAL TESTS: No significant pain increase with impingement testing.   PALPATION:  Minimal pain over right ACJ and middle deltoid region.                                                                                                                               TREATMENT DATE:   04/26/24:                                     EXERCISE LOG      RT shldr  Exercise Repetitions and Resistance Comments  UBE 10 minutes   Pulleys 5 minutes    UE Ranger on wall 5 minutes all motions   Ts and Ys Green 2x10 each way   Wall push ups        1-1 stretching x 15 minutes into right shoulder flexion and ER  HMP and IFC at 80-150 Hz on 40% scan x 15 minutes.  Normal modality resposne following removal of modality.  04/12/24:                                     EXERCISE LOG  Exercise Repetitions and Resistance Comments  UBE 8 minutes.   Pulleys 5 minutes   UE Ranger on wall 5 minutes   RW4 To fatigue with red theraband        1-1 stretching x 15 minutes into right shoulder flexion and ER f/b HMP and IFC at 80-150 Hz on 40% scan x 20 minutes.  Normal modality resposne following removal of modality.  04/05/24:   UBE x 6 mins Pulleys x 5 mins Supine cane press up 3x10,  flexion   3x10  Reviewed HEP  PROM x 15 minutes into right shoulder flexion/ ABD and ER/IR supine   Normal modality resposne following removal of modality. HMP and IFC at 80-150 Hz on 40% scan x 15 minutes to patient's right shoulder supine   PATIENT EDUCATION: Education details: See below. Person educated: Patient Education method: Medical Illustrator Education comprehension: verbalized understanding  HOME EXERCISE PROGRAM: HOME EXERCISE PROGRAM [[id number]]  PECTORALIS CORNER STRETCH -  Repeat 4 Repetitions, Hold 30 Seconds, Complete 1 Set, Perform 4 Times a Day  Shoulder Flexion Wall Walking Exercise -  Repeat 10 Repetitions, Hold 10 Seconds, Complete 2 Sets, Perform 3 Times a Day  ASSESSMENT:  CLINICAL IMPRESSION: Patient arrived today doing fairly well with decreased pain RT shldr. He was able to continue with ROM as well as strengthening exs and did well. PROM focused on ER. Progressing towards LTG's.     OBJECTIVE IMPAIRMENTS: decreased activity tolerance, decreased ROM, increased muscle spasms, postural dysfunction, and pain.   ACTIVITY LIMITATIONS: reach over head  PERSONAL FACTORS: 1  comorbidity: DM are also affecting patient's functional outcome.   REHAB POTENTIAL: Good  CLINICAL DECISION MAKING: Stable/uncomplicated  EVALUATION COMPLEXITY: Low   GOALS:  SHORT TERM GOALS: Target date: 04/26/24.  Ind with a HEP. Goal status: MET  2.  Improve  Flexion and ER by 10 degrees. Flexion 135 degrees,    ER  55 degrees Goal status: On going Flexion,  Met ER  3.  Improve QUICKDASH score by at least 3 points.  21 Goal status: MET   13     4.5%  4.  Perform ADL's with pain not > 2/10. Goal status: On going 2-3/10  PLAN:  PT FREQUENCY/DURATION: 8 visits.  PLANNED INTERVENTIONS: 97110-Therapeutic exercises, 97530- Therapeutic activity, [id number]- Neuromuscular re-education, 97535- Self Care, 02859- Manual therapy, G0283- Electrical stimulation (unattended), 97016-  Vasopneumatic device, [id number]- Ultrasound, 79439 (1-2 muscles), 20561 (3+ muscles)- Dry Needling, Patient/Family education, Cryotherapy, and Moist heat  PLAN FOR NEXT SESSION: UBE, UE Ranger, wall climbs, doorway and corner stretch.  PRE's.  Modalities and STW/M as needed.     Lauraann Missey,CHRIS, PTA 04/26/2024, 12:54 PM

## 2024-05-03 ENCOUNTER — Ambulatory Visit: Admitting: *Deleted

## 2024-05-03 DIAGNOSIS — M25611 Stiffness of right shoulder, not elsewhere classified: Secondary | ICD-10-CM

## 2024-05-03 DIAGNOSIS — M25511 Pain in right shoulder: Secondary | ICD-10-CM | POA: Diagnosis not present

## 2024-05-03 NOTE — Therapy (Signed)
 OUTPATIENT PHYSICAL THERAPY SHOULDER TREATMENT   Patient Name: Randy Mcmahon MRN: [id number] DOB:11/19/1960, 63 y.o., male Today's Date: 05/03/2024  END OF SESSION:  PT End of Session - 05/03/24 0939     Visit Number 6    Number of Visits 8    Date for Recertification  04/26/24    PT Start Time 0930    PT Stop Time 1030    PT Time Calculation (min) 60 min           Past Medical History:  Diagnosis Date   Type II or unspecified type diabetes mellitus without mention of complication, uncontrolled 12/31/2012   Past Surgical History:  Procedure Laterality Date   APPENDECTOMY  age 38   Patient Active Problem List   Diagnosis Date Noted   Hyperlipidemia associated with type 2 diabetes mellitus (HCC) 10/14/2018   Type 2 diabetes mellitus not at goal [facility]) 09/12/2015   REFERRING PROVIDER: Manus Gobble DO  REFERRING DIAG: Chronic right shoulder pain.    THERAPY DIAG:  Acute pain of right shoulder  Stiffness of right shoulder, not elsewhere classified  Rationale for Evaluation and Treatment: Rehabilitation  ONSET DATE: ~3 weeks ago.    SUBJECTIVE:                                                                                                                                                                                      SUBJECTIVE STATEMENT:  Doing better with less shldr pain RT side   PERTINENT HISTORY: DM.  PAIN:  Are you having pain? Yes: NPRS scale: 1/10.   Pain location: Right shoulder. Pain description: Ache.   Aggravating factors: Not sure. Relieving factors: Injection.  PRECAUTIONS: None  RED FLAGS: None   WEIGHT BEARING RESTRICTIONS: No  FALLS:  Has patient fallen in last 6 months? 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    PLOF: Independent  PATIENT GOALS:Use right shoulder without pain.     NEXT MD VISIT:   OBJECTIVE:  Note: Objective measures were completed at Evaluation unless otherwise noted.  DIAGNOSTIC FINDINGS:  CT scan:  03/05/24:  Moderate acromioclavicular arthrosis. Lateral subacromial enthesophyte. Suspected subacromial/subdeltoid bursitis. Distal attenuation of supraspinatus tendon, suboptimally evaluated on CT. No rotator cuff muscle atrophy. Soft tissues otherwise unremarkable.   PATIENT SURVEYS:  QUICKDASH:  20 points.  (20.5%).     POSTURE: Rounded shoulders.    UPPER EXTREMITY ROM:   Active right shoulder flexion is 130 degrees (left is 140 degrees), behind back to L4, ER is 45 degrees (left is 50 degrees).  UPPER EXTREMITY MMT:  Normal Right shoulder strength.    SHOULDER SPECIAL TESTS: No significant pain increase with impingement testing.   PALPATION:  Minimal pain over right ACJ and middle deltoid region.                                                                                                                               TREATMENT DATE:   05/03/24:                                     EXERCISE LOG      RT shldr  Exercise Repetitions and Resistance Comments  UBE 10 minutes  90 RPMs   Pulleys 5 minutes    UE Ranger on wall 5 minutes all motions   Ts and Ys RED    2x10 each way   Wall push ups 3x10       1-1 stretching x 10 minutes into right shoulder flexion and ER  HMP and IFC at 80-150 Hz on 40% scan x 15 minutes.  Normal modality resposne following removal of modality.  04/12/24:                                     EXERCISE LOG  Exercise Repetitions and Resistance Comments  UBE 8 minutes.   Pulleys 5 minutes   UE Ranger on wall 5 minutes   RW4 To fatigue with red theraband        1-1 stretching x 15 minutes into right shoulder flexion and ER f/b HMP and IFC at 80-150 Hz on 40% scan x 20 minutes.  Normal modality resposne following removal of modality.  04/05/24:   UBE x 6 mins Pulleys x 5 mins Supine  cane press up 3x10,  flexion  3x10  Reviewed HEP  PROM x 15 minutes into right shoulder flexion/ ABD and ER/IR supine   Normal modality resposne following removal of modality. HMP and IFC at 80-150 Hz on 40% scan x 15 minutes to patient's right shoulder supine   PATIENT EDUCATION: Education details: See below. Person educated: Patient Education method: Medical Illustrator Education comprehension: verbalized understanding  HOME EXERCISE PROGRAM: HOME EXERCISE PROGRAM [ZXNQUB5]  PECTORALIS CORNER STRETCH -  Repeat 4 Repetitions, Hold 30 Seconds, Complete 1 Set, Perform 4 Times a Day  Shoulder Flexion Wall Walking Exercise -  Repeat 10 Repetitions, Hold 10 Seconds, Complete 2 Sets, Perform 3 Times a Day  ASSESSMENT:  CLINICAL IMPRESSION: Patient arrived today doing fairly well with decreased pain RT shldr ovrall. Rx focused on  ROM as well as strengthening progression exs and did well. PROM focused on ER. Progressing towards LTG's.     OBJECTIVE IMPAIRMENTS: decreased activity tolerance, decreased ROM, increased muscle spasms, postural dysfunction, and pain.   ACTIVITY LIMITATIONS: reach  over head  PERSONAL FACTORS: 1 comorbidity: DM are also affecting patient's functional outcome.   REHAB POTENTIAL: Good  CLINICAL DECISION MAKING: Stable/uncomplicated  EVALUATION COMPLEXITY: Low   GOALS:  SHORT TERM GOALS: Target date: 04/26/24.  Ind with a HEP. Goal status: MET  2.  Improve  Flexion and ER by 10 degrees. Flexion 135 degrees,    ER  55 degrees Goal status: On going Flexion,  Met ER  3.  Improve QUICKDASH score by at least 3 points.  21 Goal status: MET   13     4.5%  4.  Perform ADL's with pain not > 2/10. Goal status: On going 2-3/10  PLAN:  PT FREQUENCY/DURATION: 8 visits.  PLANNED INTERVENTIONS: 97110-Therapeutic exercises, 97530- Therapeutic activity, [id number]- Neuromuscular re-education, 97535- Self Care, 02859- Manual therapy, G0283- Electrical  stimulation (unattended), 97016- Vasopneumatic device, [id number]- Ultrasound, 79439 (1-2 muscles), 20561 (3+ muscles)- Dry Needling, Patient/Family education, Cryotherapy, and Moist heat  PLAN FOR NEXT SESSION: UBE, UE Ranger, wall climbs, doorway and corner stretch.  PRE's.  Modalities and STW/M as needed.     Tya Haughey,CHRIS, PTA 05/03/2024, 12:53 PM

## 2024-05-10 ENCOUNTER — Encounter: Payer: Self-pay | Admitting: Physical Therapy

## 2024-05-10 ENCOUNTER — Ambulatory Visit: Admitting: Physical Therapy

## 2024-05-10 DIAGNOSIS — M25511 Pain in right shoulder: Secondary | ICD-10-CM | POA: Diagnosis not present

## 2024-05-10 DIAGNOSIS — M25611 Stiffness of right shoulder, not elsewhere classified: Secondary | ICD-10-CM

## 2024-05-10 NOTE — Progress Notes (Signed)
 Pharmacy Quality Measure Review  This patient is appearing on a report for being at risk of failing the adherence measure for cholesterol (statin) medications this calendar year.   Medication: Rosuvastatin  20 mg Last fill date: 04/22/24 for 90 day supply  Insurance report was not up to date. No action needed at this time.   Jenkins Graces, PharmD PGY1 Pharmacy Resident

## 2024-05-10 NOTE — Therapy (Signed)
 " OUTPATIENT PHYSICAL THERAPY SHOULDER TREATMENT   Patient Name: Randy Mcmahon MRN: [id number] DOB:1961-05-06, 63 y.o., male Today's Date: 05/10/2024  END OF SESSION:  PT End of Session - 05/10/24 0929     Visit Number 7    Number of Visits 8    Date for Recertification  04/26/24    PT Start Time 0905    PT Stop Time 1014    PT Time Calculation (min) 69 min    Activity Tolerance Patient tolerated treatment well    Behavior During Therapy [facility] for tasks assessed/performed           Past Medical History:  Diagnosis Date   Type II or unspecified type diabetes mellitus without mention of complication, uncontrolled 12/31/2012   Past Surgical History:  Procedure Laterality Date   APPENDECTOMY  age 51   Patient Active Problem List   Diagnosis Date Noted   Hyperlipidemia associated with type 2 diabetes mellitus (HCC) 10/14/2018   Type 2 diabetes mellitus not at goal [facility]) 09/12/2015   REFERRING PROVIDER: Manus Gobble DO  REFERRING DIAG: Chronic right shoulder pain.    THERAPY DIAG:  Acute pain of right shoulder  Stiffness of right shoulder, not elsewhere classified  Rationale for Evaluation and Treatment: Rehabilitation  ONSET DATE: ~3 weeks ago.    SUBJECTIVE:                                                                                                                                                                                      SUBJECTIVE STATEMENT:  Doing good.   PERTINENT HISTORY: DM.  PAIN:  Are you having pain? Yes: NPRS scale: 1/10.   Pain location: Right shoulder. Pain description: Ache.   Aggravating factors: Not sure. Relieving factors: Injection.  PRECAUTIONS: None  RED FLAGS: None   WEIGHT BEARING RESTRICTIONS: No  FALLS:  Has patient fallen in last 6 months? 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    PLOF: Independent  PATIENT GOALS:Use right shoulder without pain.    NEXT MD VISIT:   OBJECTIVE:  Note: Objective measures were completed at Evaluation unless otherwise noted.  DIAGNOSTIC FINDINGS:  CT scan:  03/05/24:  Moderate acromioclavicular arthrosis. Lateral subacromial enthesophyte. Suspected subacromial/subdeltoid bursitis. Distal attenuation of supraspinatus tendon, suboptimally evaluated on CT. No rotator cuff muscle atrophy. Soft tissues otherwise unremarkable.   PATIENT SURVEYS:  QUICKDASH:  20 points.  (20.5%).     POSTURE: Rounded shoulders.    UPPER EXTREMITY ROM:   Active right shoulder flexion is 130 degrees (left is  140 degrees), behind back to L4, ER is 45 degrees (left is 50 degrees).    UPPER EXTREMITY MMT:  Normal Right shoulder strength.    SHOULDER SPECIAL TESTS: No significant pain increase with impingement testing.   PALPATION:  Minimal pain over right ACJ and middle deltoid region.                                                                                                                               TREATMENT DATE:   05/10/24:                                     EXERCISE LOG  Exercise Repetitions and Resistance Comments  UBE 10 minutes   Pulleys 6 minutes   Wall ladder 5 minutes   Wall push-ups 3 x 10   Bench press (Apollo machine). 20#, 3 x 10   Lat PD (Apollo machine). 20#, 3 x 10   Scap retr (Apollo machine). 20#, 3 x 10   In supine:  PROM x 8 minutes into right shoulder flexion and ER to patient tolerance f/b HMP and IFC at 80-150 Hz on 40% scan x 20 minutes.    05/03/24:                                     EXERCISE LOG      RT shldr  Exercise Repetitions and Resistance Comments  UBE 10 minutes  90 RPMs   Pulleys 5 minutes    UE Ranger on wall 5 minutes all motions   Ts and Ys RED    2x10 each way   Wall push ups 3x10       1-1 stretching x 10 minutes into right shoulder flexion and ER  HMP and IFC  at 80-150 Hz on 40% scan x 15 minutes.  Normal modality resposne following removal of modality.  04/12/24:                                     EXERCISE LOG  Exercise Repetitions and Resistance Comments  UBE 8 minutes.   Pulleys 5 minutes   UE Ranger on wall 5 minutes   RW4 To fatigue with red theraband        1-1 stretching x 15 minutes into right shoulder flexion and ER f/b HMP and IFC at 80-150 Hz on 40% scan x 20 minutes.  Normal modality resposne following removal of modality.   PATIENT EDUCATION: Education details: See below. Person educated: Patient Education method: Medical Illustrator Education comprehension: verbalized understanding  HOME EXERCISE PROGRAM: HOME EXERCISE PROGRAM [[id number]]  PECTORALIS CORNER STRETCH -  Repeat 4 Repetitions, Hold 30 Seconds, Complete 1 Set, Perform 4 Times  a Day  Shoulder Flexion Wall Walking Exercise -  Repeat 10 Repetitions, Hold 10 Seconds, Complete 2 Sets, Perform 3 Times a Day  ASSESSMENT:  CLINICAL IMPRESSION: Patient did very well with the addition of circuit weight machines performing with excellent technique and no complaints.    OBJECTIVE IMPAIRMENTS: decreased activity tolerance, decreased ROM, increased muscle spasms, postural dysfunction, and pain.   ACTIVITY LIMITATIONS: reach over head  PERSONAL FACTORS: 1 comorbidity: DM are also affecting patient's functional outcome.   REHAB POTENTIAL: Good  CLINICAL DECISION MAKING: Stable/uncomplicated  EVALUATION COMPLEXITY: Low   GOALS:  SHORT TERM GOALS: Target date: 04/26/24.  Ind with a HEP. Goal status: MET  2.  Improve  Flexion and ER by 10 degrees. Flexion 135 degrees,    ER  55 degrees Goal status: On going Flexion,  Met ER  3.  Improve QUICKDASH score by at least 3 points.  21 Goal status: MET   13     4.5%  4.  Perform ADL's with pain not > 2/10. Goal status: On going 2-3/10  PLAN:  PT FREQUENCY/DURATION: 8 visits.  PLANNED INTERVENTIONS:  97110-Therapeutic exercises, 97530- Therapeutic activity, [id number]- Neuromuscular re-education, 97535- Self Care, 02859- Manual therapy, G0283- Electrical stimulation (unattended), 97016- Vasopneumatic device, [id number]- Ultrasound, 79439 (1-2 muscles), 20561 (3+ muscles)- Dry Needling, Patient/Family education, Cryotherapy, and Moist heat  PLAN FOR NEXT SESSION: UBE, UE Ranger, wall climbs, doorway and corner stretch.  PRE's.  Modalities and STW/M as needed.     Marquel Spoto, PT 05/10/2024, 10:24 AM  "

## 2024-05-17 ENCOUNTER — Ambulatory Visit: Admitting: Physical Therapy

## 2024-05-17 DIAGNOSIS — M25611 Stiffness of right shoulder, not elsewhere classified: Secondary | ICD-10-CM

## 2024-05-17 DIAGNOSIS — M25511 Pain in right shoulder: Secondary | ICD-10-CM | POA: Diagnosis not present

## 2024-05-17 NOTE — Therapy (Signed)
 " OUTPATIENT PHYSICAL THERAPY SHOULDER TREATMENT   Patient Name: Randy Mcmahon MRN: [id number] DOB:[birth year], 63 y.o., male Today's Date: 05/17/2024  END OF SESSION:  PT End of Session - 05/17/24 0902     Visit Number 8    Number of Visits 8    Date for Recertification  05/17/24    PT Start Time 0845    PT Stop Time 0945    PT Time Calculation (min) 60 min    Activity Tolerance Patient tolerated treatment well    Behavior During Therapy [facility] for tasks assessed/performed           Past Medical History:  Diagnosis Date   Type II or unspecified type diabetes mellitus without mention of complication, uncontrolled 12/31/2012   Past Surgical History:  Procedure Laterality Date   APPENDECTOMY  age 69   Patient Active Problem List   Diagnosis Date Noted   Hyperlipidemia associated with type 2 diabetes mellitus (HCC) 10/14/2018   Type 2 diabetes mellitus not at goal [facility]) 09/12/2015   REFERRING PROVIDER: Manus Gobble DO  REFERRING DIAG: Chronic right shoulder pain.    THERAPY DIAG:  Acute pain of right shoulder  Stiffness of right shoulder, not elsewhere classified  Rationale for Evaluation and Treatment: Rehabilitation  ONSET DATE: ~3 weeks ago.    SUBJECTIVE:                                                                                                                                                                                      SUBJECTIVE STATEMENT:  Doing good.   PERTINENT HISTORY: DM.  PAIN:  Are you having pain? Yes: NPRS scale: 1/10.   Pain location: Right shoulder. Pain description: Ache.   Aggravating factors: Not sure. Relieving factors: Injection.  PRECAUTIONS: None  RED FLAGS: None   WEIGHT BEARING RESTRICTIONS: No  FALLS:  Has patient fallen in last 6 months? 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    PLOF: Independent  PATIENT GOALS:Use right shoulder without pain.    NEXT MD VISIT:   OBJECTIVE:  Note: Objective measures were completed at Evaluation unless otherwise noted.  DIAGNOSTIC FINDINGS:  CT scan:  03/05/24:  Moderate acromioclavicular arthrosis. Lateral subacromial enthesophyte. Suspected subacromial/subdeltoid bursitis. Distal attenuation of supraspinatus tendon, suboptimally evaluated on CT. No rotator cuff muscle atrophy. Soft tissues otherwise unremarkable.   PATIENT SURVEYS:  QUICKDASH:  20 points.  (20.5%).     POSTURE: Rounded shoulders.    UPPER EXTREMITY ROM:   Active right shoulder flexion is 130 degrees (left is  140 degrees), behind back to L4, ER is 45 degrees (left is 50 degrees).    UPPER EXTREMITY MMT:  Normal Right shoulder strength.    SHOULDER SPECIAL TESTS: No significant pain increase with impingement testing.   PALPATION:  Minimal pain over right ACJ and middle deltoid region.                                                                                                                               TREATMENT DATE:   05/17/24:                                     EXERCISE LOG  Exercise Repetitions and Resistance Comments  UBE 10 minutes   Pulleys 5 minutes   Ball on wall 5 minutes   UE Ranger on wall 5 minutes       1-1 PROM x 13 minutes f/b HMP and IFC at 80-150 Hz on 40% scan x 15 minutes.   Normal modality resposne following removal of modality.   05/10/24:                                     EXERCISE LOG  Exercise Repetitions and Resistance Comments  UBE 10 minutes   Pulleys 6 minutes   Wall ladder 5 minutes   Wall push-ups 3 x 10   Bench press (Apollo machine). 20#, 3 x 10   Lat PD (Apollo machine). 20#, 3 x 10   Scap retr (Apollo machine). 20#, 3 x 10   In supine:  PROM x 8 minutes into right shoulder flexion and ER to patient tolerance f/b HMP and IFC at 80-150 Hz on 40% scan x 20 minutes.     05/03/24:                                     EXERCISE LOG      RT shldr  Exercise Repetitions and Resistance Comments  UBE 10 minutes  90 RPMs   Pulleys 5 minutes    UE Ranger on wall 5 minutes all motions   Ts and Ys RED    2x10 each way   Wall push ups 3x10       1-1 stretching x 10 minutes into right shoulder flexion and ER  HMP and IFC at 80-150 Hz on 40% scan x 15 minutes.  Normal modality resposne following removal of modality.  04/12/24:                                     EXERCISE LOG  Exercise Repetitions and Resistance Comments  UBE 8 minutes.  Pulleys 5 minutes   UE Ranger on wall 5 minutes   RW4 To fatigue with red theraband        1-1 stretching x 15 minutes into right shoulder flexion and ER f/b HMP and IFC at 80-150 Hz on 40% scan x 20 minutes.  Normal modality resposne following removal of modality.   PATIENT EDUCATION: Education details: See below. Person educated: Patient Education method: Medical Illustrator Education comprehension: verbalized understanding  HOME EXERCISE PROGRAM: HOME EXERCISE PROGRAM [[id number]]  PECTORALIS CORNER STRETCH -  Repeat 4 Repetitions, Hold 30 Seconds, Complete 1 Set, Perform 4 Times a Day  Shoulder Flexion Wall Walking Exercise -  Repeat 10 Repetitions, Hold 10 Seconds, Complete 2 Sets, Perform 3 Times a Day  ASSESSMENT:  CLINICAL IMPRESSION: See below.  OBJECTIVE IMPAIRMENTS: decreased activity tolerance, decreased ROM, increased muscle spasms, postural dysfunction, and pain.   ACTIVITY LIMITATIONS: reach over head  PERSONAL FACTORS: 1 comorbidity: DM are also affecting patient's functional outcome.   REHAB POTENTIAL: Good  CLINICAL DECISION MAKING: Stable/uncomplicated  EVALUATION COMPLEXITY: Low   GOALS:  SHORT TERM GOALS: Target date: 04/26/24.  Ind with a HEP. Goal status: MET  2.  Improve  Flexion and ER by 10 degrees. Flexion 135 degrees,    ER  55 degrees Goal status: MET  Flexion,  Met ER  3.  Improve QUICKDASH score by at least 3 points.  21 Goal status: MET   13     4.5%  4.  Perform ADL's with pain not > 2/10. Goal status: MET PLAN:  PT FREQUENCY/DURATION: 8 visits.  PLANNED INTERVENTIONS: 97110-Therapeutic exercises, 97530- Therapeutic activity, [id number]- Neuromuscular re-education, 97535- Self Care, 02859- Manual therapy, G0283- Electrical stimulation (unattended), 97016- Vasopneumatic device, [id number]- Ultrasound, 79439 (1-2 muscles), 20561 (3+ muscles)- Dry Needling, Patient/Family education, Cryotherapy, and Moist heat  PLAN FOR NEXT SESSION: UBE, UE Ranger, wall climbs, doorway and corner stretch.  PRE's.  Modalities and STW/M as needed.    PHYSICAL THERAPY DISCHARGE SUMMARY  Visits from Start of Care: 8.  Current functional level related to goals / functional outcomes: See above.     Remaining deficits: All goals met.   Education / Equipment: HEP and handout on obtaining a TENS unit.   Patient agrees to discharge. Patient goals were met. Patient is being discharged due to meeting the stated rehab goals.    Miliano Cotten, PT 05/17/2024, 10:08 AM  "

## 2024-06-17 ENCOUNTER — Other Ambulatory Visit: Payer: Self-pay | Admitting: Family Medicine

## 2024-06-17 DIAGNOSIS — E119 Type 2 diabetes mellitus without complications: Secondary | ICD-10-CM

## 2024-06-17 DIAGNOSIS — E1169 Type 2 diabetes mellitus with other specified complication: Secondary | ICD-10-CM

## 2024-06-17 DIAGNOSIS — R739 Hyperglycemia, unspecified: Secondary | ICD-10-CM

## 2024-07-07 ENCOUNTER — Encounter: Admitting: Family Medicine

## 2024-07-21 ENCOUNTER — Ambulatory Visit: Admitting: Family Medicine
